# Patient Record
Sex: Female | Born: 1970 | Race: White | Hispanic: No | Marital: Married | Smoking: Never smoker
Health system: Southern US, Community
[De-identification: ages and names within clinical notes are randomized; demographics above are authoritative.]

## PROBLEM LIST (undated history)

## (undated) DIAGNOSIS — B029 Zoster without complications: Secondary | ICD-10-CM

## (undated) DIAGNOSIS — G43909 Migraine, unspecified, not intractable, without status migrainosus: Secondary | ICD-10-CM

## (undated) DIAGNOSIS — J45909 Unspecified asthma, uncomplicated: Secondary | ICD-10-CM

## (undated) HISTORY — PX: HEMORRHOID SURGERY: SHX153

## (undated) HISTORY — PX: NOVASURE ABLATION: SHX5394

## (undated) HISTORY — PX: LASIK: SHX215

## (undated) HISTORY — DX: Migraine, unspecified, not intractable, without status migrainosus: G43.909

## (undated) HISTORY — DX: Zoster without complications: B02.9

## (undated) HISTORY — PX: ANKLE SURGERY: SHX546

## (undated) HISTORY — PX: NASAL SINUS SURGERY: SHX719

## (undated) HISTORY — PX: CHOLECYSTECTOMY: SHX55

---

## 2003-11-09 ENCOUNTER — Emergency Department: Payer: Self-pay | Admitting: Emergency Medicine

## 2005-02-17 ENCOUNTER — Ambulatory Visit: Payer: Self-pay | Admitting: Obstetrics and Gynecology

## 2005-02-25 ENCOUNTER — Ambulatory Visit: Payer: Self-pay | Admitting: Obstetrics and Gynecology

## 2005-06-14 ENCOUNTER — Ambulatory Visit: Payer: Self-pay | Admitting: Unknown Physician Specialty

## 2006-01-31 ENCOUNTER — Emergency Department: Payer: Self-pay | Admitting: Emergency Medicine

## 2006-03-28 ENCOUNTER — Ambulatory Visit: Payer: Self-pay | Admitting: Gastroenterology

## 2006-07-28 ENCOUNTER — Ambulatory Visit: Payer: Self-pay | Admitting: Obstetrics and Gynecology

## 2006-08-05 ENCOUNTER — Ambulatory Visit: Payer: Self-pay | Admitting: Obstetrics and Gynecology

## 2007-03-08 ENCOUNTER — Ambulatory Visit: Payer: Self-pay | Admitting: Obstetrics and Gynecology

## 2010-04-01 ENCOUNTER — Emergency Department: Payer: Self-pay | Admitting: Emergency Medicine

## 2012-02-26 ENCOUNTER — Emergency Department: Payer: Self-pay | Admitting: Emergency Medicine

## 2012-02-26 LAB — COMPREHENSIVE METABOLIC PANEL
Albumin: 4 g/dL (ref 3.4–5.0)
Anion Gap: 6 — ABNORMAL LOW (ref 7–16)
Bilirubin,Total: 0.4 mg/dL (ref 0.2–1.0)
Calcium, Total: 8.5 mg/dL (ref 8.5–10.1)
Chloride: 110 mmol/L — ABNORMAL HIGH (ref 98–107)
Co2: 24 mmol/L (ref 21–32)
EGFR (African American): >60
EGFR (Non-African Amer.): >60
Glucose: 103 mg/dL — ABNORMAL HIGH (ref 65–99)
Osmolality: 280 (ref 275–301)
Potassium: 4.1 mmol/L (ref 3.5–5.1)
SGPT (ALT): 24 U/L (ref 12–78)
Sodium: 140 mmol/L (ref 136–145)

## 2012-02-26 LAB — CBC
MCV: 94 fL (ref 80–100)
RBC: 4.52 10*6/uL (ref 3.80–5.20)
RDW: 12.6 % (ref 11.5–14.5)

## 2012-02-26 LAB — TROPONIN I: Troponin-I: <0.02 ng/mL

## 2012-02-27 LAB — CK TOTAL AND CKMB (NOT AT ARMC): CK-MB: <0.5 ng/mL — ABNORMAL LOW (ref 0.5–3.6)

## 2012-06-07 ENCOUNTER — Ambulatory Visit: Payer: Self-pay | Admitting: Obstetrics and Gynecology

## 2013-06-08 ENCOUNTER — Ambulatory Visit: Payer: Self-pay | Admitting: Obstetrics and Gynecology

## 2014-05-20 ENCOUNTER — Ambulatory Visit: Payer: Self-pay

## 2014-05-21 ENCOUNTER — Other Ambulatory Visit: Payer: Self-pay | Admitting: Obstetrics and Gynecology

## 2014-05-21 DIAGNOSIS — Z1231 Encounter for screening mammogram for malignant neoplasm of breast: Secondary | ICD-10-CM

## 2014-05-30 ENCOUNTER — Encounter: Payer: Self-pay | Admitting: *Deleted

## 2014-05-30 ENCOUNTER — Encounter
Admission: RE | Disposition: A | Discharge status: Home / Self Care | Payer: Self-pay | Source: Ambulatory Visit | Attending: Surgery

## 2014-05-30 ENCOUNTER — Ambulatory Visit: Payer: No Typology Code available for payment source | Admitting: Anesthesiology

## 2014-05-30 ENCOUNTER — Ambulatory Visit
Admission: RE | Admit: 2014-05-30 | Discharge: 2014-05-30 | Disposition: A | Discharge status: Home / Self Care | Payer: No Typology Code available for payment source | Source: Ambulatory Visit | Attending: Surgery | Admitting: Surgery

## 2014-05-30 DIAGNOSIS — K648 Other hemorrhoids: Secondary | ICD-10-CM | POA: Diagnosis not present

## 2014-05-30 DIAGNOSIS — K644 Residual hemorrhoidal skin tags: Secondary | ICD-10-CM | POA: Diagnosis not present

## 2014-05-30 HISTORY — PX: HEMORRHOID SURGERY: SHX153

## 2014-05-30 SURGERY — HEMORRHOIDECTOMY
Anesthesia: General

## 2014-05-30 MED ORDER — FENTANYL CITRATE (PF) 100 MCG/2ML IJ SOLN
INTRAMUSCULAR | Status: DC | PRN
  Administered 2014-05-30 (×3): 50 ug via INTRAVENOUS
  Administered 2014-05-30: 100 ug via INTRAVENOUS

## 2014-05-30 MED ORDER — FAMOTIDINE 20 MG PO TABS
ORAL_TABLET | ORAL | Status: AC
  Administered 2014-05-30: 20 mg via ORAL
  Filled 2014-05-30: qty 1

## 2014-05-30 MED ORDER — LIDOCAINE HCL (CARDIAC) 20 MG/ML IV SOLN
INTRAVENOUS | Status: DC | PRN
  Administered 2014-05-30: 100 mg via INTRAVENOUS

## 2014-05-30 MED ORDER — BUPIVACAINE-EPINEPHRINE (PF) 0.5% -1:200000 IJ SOLN
INTRAMUSCULAR | Status: AC
  Filled 2014-05-30: qty 30

## 2014-05-30 MED ORDER — FAMOTIDINE 20 MG PO TABS
20.0000 mg | ORAL_TABLET | Freq: Once | ORAL | Status: AC
  Administered 2014-05-30: 20 mg via ORAL

## 2014-05-30 MED ORDER — FENTANYL CITRATE (PF) 100 MCG/2ML IJ SOLN: 25.0000 ug | INTRAMUSCULAR | Status: DC | PRN

## 2014-05-30 MED ORDER — ONDANSETRON HCL 4 MG/2ML IJ SOLN: 4.0000 mg | Freq: Once | INTRAMUSCULAR | Status: DC | PRN

## 2014-05-30 MED ORDER — PHENYLEPHRINE HCL 10 MG/ML IJ SOLN
INTRAMUSCULAR | Status: DC | PRN
  Administered 2014-05-30: 150 ug via INTRAVENOUS

## 2014-05-30 MED ORDER — LACTATED RINGERS IV SOLN
INTRAVENOUS | Status: DC
  Administered 2014-05-30 (×2): via INTRAVENOUS

## 2014-05-30 MED ORDER — MIDAZOLAM HCL 2 MG/2ML IJ SOLN
INTRAMUSCULAR | Status: DC | PRN
  Administered 2014-05-30: 1 mg via INTRAVENOUS

## 2014-05-30 MED ORDER — BUPIVACAINE LIPOSOME 1.3 % IJ SUSP
INTRAMUSCULAR | Status: DC | PRN
  Administered 2014-05-30: 20 mL

## 2014-05-30 MED ORDER — DEXAMETHASONE SODIUM PHOSPHATE 4 MG/ML IJ SOLN
INTRAMUSCULAR | Status: DC | PRN
  Administered 2014-05-30: 5 mg via INTRAVENOUS

## 2014-05-30 MED ORDER — DIPHENHYDRAMINE HCL 50 MG/ML IJ SOLN
INTRAMUSCULAR | Status: DC | PRN
Start: 2014-05-30 — End: 2014-05-30
  Administered 2014-05-30: 10 mg via INTRAVENOUS

## 2014-05-30 MED ORDER — OXYCODONE-ACETAMINOPHEN 5-325 MG PO TABS: 1.0000 | ORAL_TABLET | ORAL | Status: DC | PRN

## 2014-05-30 MED ORDER — PROPOFOL 10 MG/ML IV BOLUS
INTRAVENOUS | Status: DC | PRN
  Administered 2014-05-30: 30 mg via INTRAVENOUS
  Administered 2014-05-30: 20 mg via INTRAVENOUS
  Administered 2014-05-30: 150 mg via INTRAVENOUS

## 2014-05-30 MED ORDER — ONDANSETRON HCL 4 MG/2ML IJ SOLN
INTRAMUSCULAR | Status: DC | PRN
  Administered 2014-05-30: 4 mg via INTRAVENOUS

## 2014-05-30 SURGICAL SUPPLY — 25 items
BLADE SURG 15 STRL LF DISP TIS (BLADE) ×1 IMPLANT
BLADE SURG 15 STRL SS (BLADE) ×2
CANISTER SUCT 1200ML W/VALVE (MISCELLANEOUS) ×3 IMPLANT
DRAPE LAPAROTOMY 100X77 ABD (DRAPES) ×3 IMPLANT
DRAPE LEGGINS SURG 28X43 STRL (DRAPES) ×3 IMPLANT
GAUZE SPONGE 4X4 12PLY STRL (GAUZE/BANDAGES/DRESSINGS) ×3 IMPLANT
GLOVE BIO SURGEON STRL SZ7.5 (GLOVE) ×9 IMPLANT
GOWN STRL REUS W/ TWL LRG LVL3 (GOWN DISPOSABLE) ×2 IMPLANT
GOWN STRL REUS W/TWL LRG LVL3 (GOWN DISPOSABLE) ×4
HARMONIC SCALPEL FOCUS (MISCELLANEOUS) ×3 IMPLANT
JELLY LUB 2OZ STRL (MISCELLANEOUS) ×2
JELLY LUBE 2OZ STRL (MISCELLANEOUS) ×1 IMPLANT
LABEL OR SOLS (LABEL) ×3 IMPLANT
NDL SAFETY 25GX1.5 (NEEDLE) ×3 IMPLANT
NS IRRIG 500ML POUR BTL (IV SOLUTION) ×3 IMPLANT
PACK BASIN MINOR ARMC (MISCELLANEOUS) ×3 IMPLANT
PAD GROUND ADULT SPLIT (MISCELLANEOUS) ×3 IMPLANT
PAD PREP 24X41 OB/GYN DISP (PERSONAL CARE ITEMS) IMPLANT
SOL PREP PVP 2OZ (MISCELLANEOUS) ×6
SOLUTION PREP PVP 2OZ (MISCELLANEOUS) ×2 IMPLANT
STAPLER PROXIMATE HCS (STAPLE) IMPLANT
STRAP SAFETY BODY (MISCELLANEOUS) ×3 IMPLANT
SUT CHROMIC 0 SH (SUTURE) ×3 IMPLANT
SUT CHROMIC 2 0 SH (SUTURE) ×3 IMPLANT
SYRINGE 10CC LL (SYRINGE) ×3 IMPLANT

## 2014-05-30 NOTE — Op Note (Signed)
Preoperative diagnosis hemorrhoids  Postoperative diagnosis hemorrhoids  Procedure internal and external hemorrhoidectomy  Anesthesia Gen.  Surgeon Renda RollsWilton Cathaleen Korol  Indications: She is a history of anal pain and swelling and bleeding. Internal and external hemorrhoids were demonstrated on physical exam.  With the patient on the operating table in the supine position she was placed under general anesthesia. The legs were elevated into the lithotomy position using ankle straps. The anal area was prepared with Betadine solution and draped in a sterile manner. The anal canal was dilated large enough to admit 3 fingers. The anal canal was then further dilated with a bivalve anal retractor. Large internal and external hemorrhoids were demonstrated at the 5:00 7:00 and 11:00 position. First the hemorrhoid at the 7:00 position was removed by placing a 2-0 chromic suture ligature of both the internal component. The external component was scored with a scalpel. Dissection was carried out with electrocautery. The hemorrhoid was dissected free from the underlying internal anal sphincter. The hemorrhoid was then ligated with the same suture ligature and amputated. Several small bleeding points are cauterized. The wound was closed with a running 2-0 chromic locked tied stitch leaving a small opening externally for drainage.  A similar procedure was carried out at the 11:00 position with a similar 2-0 chromic suture ligature there was a large external component which was scored with a scalpel and dissected free from surrounding structures. The excision was carried out up over the internal anal sphincter to the previously placed suture ligature and further ligated. The hemorrhoid was amputated. The wound was inspected and several small bleeding points were cauterized. The wound was closed with a running locked tied 2-0 chromic suture leaving a small opening externally for drainage.  A similar procedure was carried out  at the 5:00 position lacing and 3-0 chromic suture ligature at the upper end of the internal component. The external component was scored with a scalpel. The hemorrhoid was dissected away from the underlying internal anal sphincter with use of electrocautery. When the partial cauterized. The wound was closed in a similar manner with running locked tied 3-0 chromic suture  There was another skin tag incised at approximately 1:00 position and repaired with 3-0 chromic.  Hemostasis was intact. The site was then prepared with Betadine solution the skin and subcutaneous tissues and deeper tissues surrounding the sphincter were infiltrated with 20 cc of Exparel. Dressings were applied with paper tape. The patient appeared to tolerate the procedure satisfactorily and was then prepared for transfer to the recovery room.  Estimated blood loss 10 cc.

## 2014-05-30 NOTE — Progress Notes (Signed)
Report given to Thrivent Financialterry duncan, rn.

## 2014-05-30 NOTE — Anesthesia Postprocedure Evaluation (Signed)
  Anesthesia Post-op Note  Patient: Diane Smith  Procedure(s) Performed: Procedure(s): HEMORRHOIDECTOMY (N/A)  Anesthesia type:General LMA  Patient location: PACU  Post pain: Pain level controlled  Post assessment: Post-op Vital signs reviewed, Patient's Cardiovascular Status Stable, Respiratory Function Stable, Patent Airway and No signs of Nausea or vomiting  Post vital signs: Reviewed and stable  Last Vitals:  Filed Vitals:   05/30/14 0938  BP: 137/85  Pulse: 95  Temp:   Resp: 16    Level of consciousness: awake, alert  and patient cooperative  Complications: No apparent anesthesia complications

## 2014-05-30 NOTE — Discharge Instructions (Signed)
°   Call MD for:  temperature >100.4  Renda RollsWilton Smith, MD New at Discharge     Call MD for:  persistant nausea and vomiting  Renda RollsWilton Smith, MD New at Discharge     Call MD for:  severe uncontrolled pain  Renda RollsWilton Smith, MD New at Discharge     Call MD for:  redness, tenderness, or signs of infection (pain, swelling, redness, odor or green/yellow discharge around incision site)  Renda RollsWilton Smith, MD New at Discharge     Call MD for:  difficulty breathing, headache or visual disturbances  Renda RollsWilton Smith, MD New at Discharge     Call MD for:  hives  Renda RollsWilton Smith, MD New at Discharge     Call MD for:  persistant dizziness or light-headedness  Renda RollsWilton Smith, MD New at Discharge     Call MD for:  extreme fatigue  Renda RollsWilton Smith, MD New at Discharge     Discharge instructions  Renda RollsWilton Smith, MD New at Discharge

## 2014-05-30 NOTE — Anesthesia Preprocedure Evaluation (Signed)
Anesthesia Evaluation  Patient identified by MRN, date of birth, ID band Patient awake    Reviewed: Allergy & Precautions, H&P , NPO status , Patient's Chart, lab work & pertinent test results, reviewed documented beta blocker date and time   History of Anesthesia Complications (+) PONV  Airway Mallampati: II  TM Distance: >3 FB Neck ROM: full    Dental   Pulmonary Current Smoker,          Cardiovascular Rate:Normal     Neuro/Psych  Headaches,    GI/Hepatic   Endo/Other    Renal/GU      Musculoskeletal   Abdominal   Peds  Hematology   Anesthesia Other Findings   Reproductive/Obstetrics                             Anesthesia Physical Anesthesia Plan  ASA: II  Anesthesia Plan: General LMA   Post-op Pain Management:    Induction:   Airway Management Planned:   Additional Equipment:   Intra-op Plan:   Post-operative Plan:   Informed Consent: I have reviewed the patients History and Physical, chart, labs and discussed the procedure including the risks, benefits and alternatives for the proposed anesthesia with the patient or authorized representative who has indicated his/her understanding and acceptance.     Plan Discussed with: CRNA  Anesthesia Plan Comments:         Anesthesia Quick Evaluation

## 2014-05-30 NOTE — Transfer of Care (Signed)
Immediate Anesthesia Transfer of Care Note  Patient: Diane Smith  Procedure(s) Performed: Procedure(s): HEMORRHOIDECTOMY (N/A)  Patient Location: PACU  Anesthesia Type:General  Level of Consciousness: sedated  Airway & Oxygen Therapy: Patient connected to nasal cannula oxygen  Post-op Assessment: Report given to RN  Post vital signs: Reviewed and stable  Last Vitals:  Filed Vitals:   05/30/14 0633  BP: 121/81  Pulse: 99  Temp: 37.6 C  Resp: 16    Complications: No apparent anesthesia complications

## 2014-05-30 NOTE — H&P (Signed)
  Date of Initial H&P: 05/01/2014  History reviewed, patient examined, no change in status, stable for surgery. 

## 2014-05-30 NOTE — Anesthesia Procedure Notes (Signed)
Procedure Name: LMA Insertion Date/Time: 05/30/2014 7:41 AM Performed by: Renda RollsSMITH, WILTON Oxygen Delivery Method: Circle system utilized Preoxygenation: Pre-oxygenation with 100% oxygen LMA Size: 4.0 Dental Injury: Teeth and Oropharynx as per pre-operative assessment

## 2014-05-31 LAB — SURGICAL PATHOLOGY

## 2014-06-01 ENCOUNTER — Emergency Department
Admission: EM | Admit: 2014-06-01 | Discharge: 2014-06-01 | Disposition: A | Discharge status: Home / Self Care | Payer: No Typology Code available for payment source | Attending: Emergency Medicine | Admitting: Emergency Medicine

## 2014-06-01 ENCOUNTER — Encounter: Payer: Self-pay | Admitting: Emergency Medicine

## 2014-06-01 ENCOUNTER — Other Ambulatory Visit: Payer: Self-pay

## 2014-06-01 ENCOUNTER — Emergency Department: Payer: No Typology Code available for payment source

## 2014-06-01 DIAGNOSIS — I951 Orthostatic hypotension: Secondary | ICD-10-CM | POA: Insufficient documentation

## 2014-06-01 DIAGNOSIS — Z79899 Other long term (current) drug therapy: Secondary | ICD-10-CM | POA: Diagnosis not present

## 2014-06-01 DIAGNOSIS — R55 Syncope and collapse: Secondary | ICD-10-CM | POA: Diagnosis present

## 2014-06-01 LAB — BASIC METABOLIC PANEL
ANION GAP: 7 (ref 5–15)
BUN: 13 mg/dL (ref 6–20)
CO2: 24 mmol/L (ref 22–32)
CREATININE: 0.57 mg/dL (ref 0.44–1.00)
Calcium: 8.2 mg/dL — ABNORMAL LOW (ref 8.9–10.3)
Chloride: 101 mmol/L (ref 101–111)
GFR calc Af Amer: >60 mL/min (ref >60)
Glucose, Bld: 104 mg/dL — ABNORMAL HIGH (ref 65–99)
Potassium: 3.2 mmol/L — ABNORMAL LOW (ref 3.5–5.1)
Sodium: 132 mmol/L — ABNORMAL LOW (ref 135–145)

## 2014-06-01 LAB — URINALYSIS COMPLETE WITH MICROSCOPIC (ARMC ONLY)
Bilirubin Urine: NEGATIVE
GLUCOSE, UA: NEGATIVE mg/dL
Hgb urine dipstick: NEGATIVE
LEUKOCYTES UA: NEGATIVE
NITRITE: NEGATIVE
Protein, ur: NEGATIVE mg/dL
Specific Gravity, Urine: 1.006 (ref 1.005–1.030)
pH: 8 (ref 5.0–8.0)

## 2014-06-01 LAB — CBC
HCT: 41 % (ref 35.0–47.0)
Hemoglobin: 14 g/dL (ref 12.0–16.0)
MCH: 32.3 pg (ref 26.0–34.0)
MCHC: 34.1 g/dL (ref 32.0–36.0)
MCV: 94.8 fL (ref 80.0–100.0)
PLATELETS: 284 10*3/uL (ref 150–440)
RBC: 4.32 MIL/uL (ref 3.80–5.20)
RDW: 12.8 % (ref 11.5–14.5)
WBC: 7.9 10*3/uL (ref 3.6–11.0)

## 2014-06-01 LAB — TROPONIN I

## 2014-06-01 MED ORDER — SODIUM CHLORIDE 0.9 % IV BOLUS (SEPSIS)
1000.0000 mL | Freq: Once | INTRAVENOUS | Status: AC
  Administered 2014-06-01: 1000 mL via INTRAVENOUS

## 2014-06-01 MED ORDER — SENNOSIDES-DOCUSATE SODIUM 8.6-50 MG PO TABS
2.0000 | ORAL_TABLET | Freq: Once | ORAL | Status: AC
  Administered 2014-06-01: 2 via ORAL

## 2014-06-01 MED ORDER — OXYCODONE-ACETAMINOPHEN 5-325 MG PO TABS
1.0000 | ORAL_TABLET | Freq: Once | ORAL | Status: AC
  Administered 2014-06-01: 1 via ORAL

## 2014-06-01 MED ORDER — OXYCODONE-ACETAMINOPHEN 5-325 MG PO TABS
ORAL_TABLET | ORAL | Status: AC
  Filled 2014-06-01: qty 1

## 2014-06-01 MED ORDER — SENNOSIDES-DOCUSATE SODIUM 8.6-50 MG PO TABS
ORAL_TABLET | ORAL | Status: AC
  Filled 2014-06-01: qty 2

## 2014-06-01 NOTE — ED Provider Notes (Signed)
[facility] Emergency Department Provider Note  ____________________________________________  Time seen: 1500  I have reviewed the triage vital signs and the nursing notes.   HISTORY  Chief Complaint Near Syncope      HPI Erie NoeKimberly M Chevez is a 44 y.o. female presents with 3 episodes of syncope. Per patient and spouse at bedside patient had syncopal episode yesterday while while attempting to defecate. Patient had another episode following. Today patient had a near syncopal episode while sitting at the table. Of note patient really recently underwenthemorrhoid surgery by Dr. Corwin LevinsWilliam Smith on 05/30/2014. Patient denies any bowel movements since surgery. Patient denies any recent illness. Denies nausea or vomiting or diarrhea.     Past Medical History  Diagnosis Date  . Shingles   . Migraine     There are no active problems to display for this patient.   Past Surgical History  Procedure Laterality Date  . Cholecystectomy    . Novasure ablation    . Nasal sinus surgery    . Ankle surgery Left     with screw  . Lasik    . Hemorrhoid surgery      Current Outpatient Rx  Name  Route  Sig  Dispense  Refill  . cholecalciferol (VITAMIN D) 1000 UNITS tablet   Oral   Take 5,000 Units by mouth daily.         Marland Kitchen. Fexofenadine-Pseudoephedrine (ALLEGRA-D 24 HOUR PO)   Oral   Take 1 tablet by mouth daily.         Marland Kitchen. oxyCODONE-acetaminophen (ROXICET) 5-325 MG per tablet   Oral   Take 1-2 tablets by mouth every 4 (four) hours as needed for severe pain.   30 tablet   0     Allergies Imodium a-d  No family history on file.  Social History History  Substance Use Topics  . Smoking status: Never Smoker   . Smokeless tobacco: Not on file  . Alcohol Use: 0.0 oz/week    0 Glasses of wine per week    Review of Systems  Constitutional: Negative for fever. Eyes: Negative for visual changes. ENT: Negative for sore throat. Cardiovascular:  Negative for chest pain. Respiratory: Negative for shortness of breath. Gastrointestinal: Negative for abdominal pain, vomiting and diarrhea. Genitourinary: Negative for dysuria. Musculoskeletal: Negative for back pain. Skin: Negative for rash. Neurological: Negative for headaches, focal weakness or numbness.   10-point ROS otherwise negative.  ____________________________________________   PHYSICAL EXAM:  VITAL SIGNS: ED Triage Vitals  Enc Vitals Group     BP 06/01/14 1154 116/71 mmHg     Pulse Rate 06/01/14 1154 84     Resp 06/01/14 1154 18     Temp 06/01/14 1154 97.9 F (36.6 C)     Temp Source 06/01/14 1154 Oral     SpO2 06/01/14 1154 99 %     Weight 06/01/14 1154 145 lb (65.772 kg)     Height 06/01/14 1154 5\' 2"  (1.575 m)     Head Cir --      Peak Flow --      Pain Score 06/01/14 1155 5     Pain Loc --      Pain Edu? --      Excl. in GC? --     Constitutional: Alert and oriented. Well appearing and in no distress. Eyes: Conjunctivae are normal. PERRL. Normal extraocular movements. ENT   Head: Normocephalic and atraumatic.   Nose: No congestion/rhinnorhea.   Mouth/Throat: Mucous membranes are moist.  Neck: No stridor. Cardiovascular: Normal rate, regular rhythm. Normal and symmetric distal pulses are present in all extremities. No murmurs, rubs, or gallops. Respiratory: Normal respiratory effort without tachypnea nor retractions. Breath sounds are clear and equal bilaterally. No wheezes/rales/rhonchi. Gastrointestinal: Soft and nontender. No distention. There is no CVA tenderness. Genitourinary: deferred Musculoskeletal: Nontender with normal range of motion in all extremities. No joint effusions.  No lower extremity tenderness nor edema. Neurologic:  Normal speech and language. No gross focal neurologic deficits are appreciated. Speech is normal.  Skin:  Skin is warm, dry and intact. No rash noted. Psychiatric: Mood and affect are normal. Speech and  behavior are normal. Patient exhibits appropriate insight and judgment.  ____________________________________________    LABS (pertinent positives/negatives) Labs Reviewed  BASIC METABOLIC PANEL - Abnormal; Notable for the following:    Sodium 132 (*)    Potassium 3.2 (*)    Glucose, Bld 104 (*)    Calcium 8.2 (*)    All other components within normal limits  URINALYSIS COMPLETEWITH MICROSCOPIC ([facility])  - Abnormal; Notable for the following:    Color, Urine YELLOW (*)    APPearance CLEAR (*)    Ketones, ur TRACE (*)    Bacteria, UA RARE (*)    Squamous Epithelial / LPF 0-5 (*)    All other components within normal limits  CBC  TROPONIN I     ____________________________________________   EKG    Date: 06/01/2014  Rate: 78  Rhythm: normal sinus rhythm  QRS Axis: normal  Intervals: normal  ST/T Wave abnormalities: normal  Conduction Disutrbances: none  Narrative Interpretation: unremarkable      ____________________________________________    RADIOLOGY  CT head revealed no acute abnormality.  ____________________________________________   ____________________________________________   INITIAL IMPRESSION / ASSESSMENT AND PLAN / ED COURSE  Pertinent labs & imaging results that were available during my care of the patient were reviewed by me and considered in my medical decision making (see chart for details).  History and physical exam of near syncope and syncope CT head performed which was negative lab evaluation unremarkable including negative UA normal H&H. [facility] patient will be discharged home with outpatient follow-up. Patient discussed with Dr. Derrill KayWill Smith CLINICAL findings discussed patient will follow up on the outpatient setting. Cautioned both patient and husband to return to the emergency department immediately if recurrent syncopal episodes ensue. Of note patient received normal saline 2 L IV in emergency  department. Patient states: I feel better".  ____________________________________________   FINAL CLINICAL IMPRESSION(S) / ED DIAGNOSES  Final diagnoses:  Syncope due to orthostatic hypotension    Darci Currentandolph N Yutaka Holberg, MD 06/01/14 1806

## 2014-06-01 NOTE — ED Notes (Signed)
Patient return from CT

## 2014-06-01 NOTE — Discharge Instructions (Signed)
Syncope °Syncope is a medical term for fainting or passing out. This means you lose consciousness and drop to the ground. People are generally unconscious for less than 5 minutes. You may have some muscle twitches for up to 15 seconds before waking up and returning to normal. Syncope occurs more often in older adults, but it can happen to anyone. While most causes of syncope are not dangerous, syncope can be a sign of a serious medical problem. It is important to seek medical care.  °CAUSES  °Syncope is caused by a sudden drop in blood flow to the brain. The specific cause is often not determined. Factors that can bring on syncope include: °· Taking medicines that lower blood pressure. °· Sudden changes in posture, such as standing up quickly. °· Taking more medicine than prescribed. °· Standing in one place for too long. °· Seizure disorders. °· Dehydration and excessive exposure to heat. °· Low blood sugar (hypoglycemia). °· Straining to have a bowel movement. °· Heart disease, irregular heartbeat, or other circulatory problems. °· Fear, emotional distress, seeing blood, or severe pain. °SYMPTOMS  °Right before fainting, you may: °· Feel dizzy or light-headed. °· Feel nauseous. °· See all white or all black in your field of vision. °· Have cold, clammy skin. °DIAGNOSIS  °Your health care provider will ask about your symptoms, perform a physical exam, and perform an electrocardiogram (ECG) to record the electrical activity of your heart. Your health care provider may also perform other heart or blood tests to determine the cause of your syncope which may include: °· Transthoracic echocardiogram (TTE). During echocardiography, sound waves are used to evaluate how blood flows through your heart. °· Transesophageal echocardiogram (TEE). °· Cardiac monitoring. This allows your health care provider to monitor your heart rate and rhythm in real time. °· Holter monitor. This is a portable device that records your  heartbeat and can help diagnose heart arrhythmias. It allows your health care provider to track your heart activity for several days, if needed. °· Stress tests by exercise or by giving medicine that makes the heart beat faster. °TREATMENT  °In most cases, no treatment is needed. Depending on the cause of your syncope, your health care provider may recommend changing or stopping some of your medicines. °HOME CARE INSTRUCTIONS °· Have someone stay with you until you feel stable. °· Do not drive, use machinery, or play sports until your health care provider says it is okay. °· Keep all follow-up appointments as directed by your health care provider. °· Lie down right away if you start feeling like you might faint. Breathe deeply and steadily. Wait until all the symptoms have passed. °· Drink enough fluids to keep your urine clear or pale yellow. °· If you are taking blood pressure or heart medicine, get up slowly and take several minutes to sit and then stand. This can reduce dizziness. °SEEK IMMEDIATE MEDICAL CARE IF:  °· You have a severe headache. °· You have unusual pain in the chest, abdomen, or back. °· You are bleeding from your mouth or rectum, or you have black or tarry stool. °· You have an irregular or very fast heartbeat. °· You have pain with breathing. °· You have repeated fainting or seizure-like jerking during an episode. °· You faint when sitting or lying down. °· You have confusion. °· You have trouble walking. °· You have severe weakness. °· You have vision problems. °If you fainted, call your local emergency services (911 in [place] Do not drive   yourself to the hospital.  °MAKE SURE YOU: °· Understand these instructions. °· Will watch your condition. °· Will get help right away if you are not doing well or get worse. °Document Released: 01/11/2005 Document Revised: 01/16/2013 Document Reviewed: 03/12/2011 °[employer]® Patient Information ©2015 [employer]. This information is not intended to replace  advice given to you by your health care provider. Make sure you discuss any questions you have with your health care provider. ° °

## 2014-06-01 NOTE — ED Notes (Signed)
States had hemorrhoidectomy 2 days ago, near syncopal feeling x 3 since, denies blood loss

## 2014-06-01 NOTE — ED Notes (Signed)
Patient transported to CT 

## 2014-06-01 NOTE — ED Notes (Signed)
Patient has all systems WNL. However, has a recent history of syncope and near syncope while "on the commode" and during ambulation. Denies CP, ShOB, Nausea, Vomiting, Diarrhea, and Constipation. No HX of urinary dysfunction.

## 2014-06-04 ENCOUNTER — Encounter: Payer: Self-pay | Admitting: Surgery

## 2014-06-10 ENCOUNTER — Ambulatory Visit
Admission: RE | Admit: 2014-06-10 | Discharge: 2014-06-10 | Disposition: A | Discharge status: Home / Self Care | Payer: No Typology Code available for payment source | Source: Ambulatory Visit | Attending: Obstetrics and Gynecology | Admitting: Obstetrics and Gynecology

## 2014-06-10 DIAGNOSIS — Z1231 Encounter for screening mammogram for malignant neoplasm of breast: Secondary | ICD-10-CM | POA: Insufficient documentation

## 2014-12-19 ENCOUNTER — Emergency Department
Admission: EM | Admit: 2014-12-19 | Discharge: 2014-12-19 | Disposition: A | Discharge status: Home / Self Care | Payer: No Typology Code available for payment source | Attending: Emergency Medicine | Admitting: Emergency Medicine

## 2014-12-19 ENCOUNTER — Encounter: Payer: Self-pay | Admitting: *Deleted

## 2014-12-19 DIAGNOSIS — R1030 Lower abdominal pain, unspecified: Secondary | ICD-10-CM | POA: Insufficient documentation

## 2014-12-19 DIAGNOSIS — E860 Dehydration: Secondary | ICD-10-CM | POA: Insufficient documentation

## 2014-12-19 DIAGNOSIS — R519 Headache, unspecified: Secondary | ICD-10-CM

## 2014-12-19 DIAGNOSIS — R531 Weakness: Secondary | ICD-10-CM | POA: Insufficient documentation

## 2014-12-19 DIAGNOSIS — R350 Frequency of micturition: Secondary | ICD-10-CM | POA: Insufficient documentation

## 2014-12-19 DIAGNOSIS — R300 Dysuria: Secondary | ICD-10-CM | POA: Diagnosis not present

## 2014-12-19 DIAGNOSIS — R51 Headache: Secondary | ICD-10-CM | POA: Insufficient documentation

## 2014-12-19 DIAGNOSIS — R509 Fever, unspecified: Secondary | ICD-10-CM | POA: Insufficient documentation

## 2014-12-19 DIAGNOSIS — M545 Low back pain: Secondary | ICD-10-CM | POA: Diagnosis not present

## 2014-12-19 DIAGNOSIS — R000 Tachycardia, unspecified: Secondary | ICD-10-CM | POA: Insufficient documentation

## 2014-12-19 DIAGNOSIS — Z79899 Other long term (current) drug therapy: Secondary | ICD-10-CM | POA: Insufficient documentation

## 2014-12-19 LAB — COMPREHENSIVE METABOLIC PANEL
ALT: 14 U/L (ref 14–54)
ANION GAP: 6 (ref 5–15)
AST: 20 U/L (ref 15–41)
Albumin: 3.9 g/dL (ref 3.5–5.0)
Alkaline Phosphatase: 76 U/L (ref 38–126)
BUN: 16 mg/dL (ref 6–20)
CO2: 25 mmol/L (ref 22–32)
Calcium: 8.6 mg/dL — ABNORMAL LOW (ref 8.9–10.3)
Chloride: 104 mmol/L (ref 101–111)
Creatinine, Ser: 0.56 mg/dL (ref 0.44–1.00)
GFR calc Af Amer: >60 mL/min (ref >60)
Glucose, Bld: 110 mg/dL — ABNORMAL HIGH (ref 65–99)
Potassium: 3.7 mmol/L (ref 3.5–5.1)
SODIUM: 135 mmol/L (ref 135–145)
Total Bilirubin: 0.8 mg/dL (ref 0.3–1.2)
Total Protein: 7 g/dL (ref 6.5–8.1)

## 2014-12-19 LAB — CBC WITH DIFFERENTIAL/PLATELET
BASOS ABS: 0 10*3/uL (ref 0–0.1)
BASOS PCT: 0 %
EOS ABS: 0.2 10*3/uL (ref 0–0.7)
EOS PCT: 2 %
HCT: 42.1 % (ref 35.0–47.0)
Hemoglobin: 13.9 g/dL (ref 12.0–16.0)
Lymphocytes Relative: 2 %
Lymphs Abs: 0.3 10*3/uL — ABNORMAL LOW (ref 1.0–3.6)
MCH: 31.1 pg (ref 26.0–34.0)
MCHC: 33 g/dL (ref 32.0–36.0)
MCV: 94.3 fL (ref 80.0–100.0)
Monocytes Absolute: 0.7 10*3/uL (ref 0.2–0.9)
Monocytes Relative: 5 %
NEUTROS PCT: 91 %
Neutro Abs: 13.8 10*3/uL — ABNORMAL HIGH (ref 1.4–6.5)
PLATELETS: 291 10*3/uL (ref 150–440)
RBC: 4.47 MIL/uL (ref 3.80–5.20)
RDW: 12.6 % (ref 11.5–14.5)
WBC: 15 10*3/uL — AB (ref 3.6–11.0)

## 2014-12-19 LAB — URINALYSIS COMPLETE WITH MICROSCOPIC (ARMC ONLY)
BILIRUBIN URINE: NEGATIVE
Glucose, UA: NEGATIVE mg/dL
Hgb urine dipstick: NEGATIVE
LEUKOCYTES UA: NEGATIVE
NITRITE: POSITIVE — AB
PH: 6 (ref 5.0–8.0)
Protein, ur: NEGATIVE mg/dL
RBC / HPF: NONE SEEN RBC/hpf (ref 0–5)
SPECIFIC GRAVITY, URINE: 1.018 (ref 1.005–1.030)

## 2014-12-19 LAB — LIPASE, BLOOD: LIPASE: 24 U/L (ref 11–51)

## 2014-12-19 MED ORDER — DIPHENHYDRAMINE HCL 25 MG PO CAPS: 25.0000 mg | ORAL_CAPSULE | Freq: Four times a day (QID) | ORAL | Status: DC | PRN

## 2014-12-19 MED ORDER — DIPHENHYDRAMINE HCL 50 MG/ML IJ SOLN
25.0000 mg | Freq: Once | INTRAMUSCULAR | Status: AC
  Administered 2014-12-19: 25 mg via INTRAVENOUS
  Filled 2014-12-19: qty 1

## 2014-12-19 MED ORDER — METOCLOPRAMIDE HCL 5 MG/ML IJ SOLN
10.0000 mg | Freq: Once | INTRAMUSCULAR | Status: AC
  Administered 2014-12-19: 10 mg via INTRAVENOUS
  Filled 2014-12-19: qty 2

## 2014-12-19 MED ORDER — METOCLOPRAMIDE HCL 10 MG PO TABS: 10.0000 mg | ORAL_TABLET | Freq: Three times a day (TID) | ORAL | Status: DC

## 2014-12-19 MED ORDER — METHYLPREDNISOLONE SODIUM SUCC 125 MG IJ SOLR
125.0000 mg | Freq: Once | INTRAMUSCULAR | Status: AC
  Administered 2014-12-19: 125 mg via INTRAVENOUS
  Filled 2014-12-19: qty 2

## 2014-12-19 MED ORDER — SODIUM CHLORIDE 0.9 % IV BOLUS (SEPSIS)
2000.0000 mL | Freq: Once | INTRAVENOUS | Status: AC
  Administered 2014-12-19: 1000 mL via INTRAVENOUS

## 2014-12-19 MED ORDER — KETOROLAC TROMETHAMINE 30 MG/ML IJ SOLN
30.0000 mg | Freq: Once | INTRAMUSCULAR | Status: AC
  Administered 2014-12-19: 30 mg via INTRAVENOUS
  Filled 2014-12-19: qty 1

## 2014-12-19 NOTE — Discharge Instructions (Signed)
Dehydration, Adult Dehydration is a condition in which you do not have enough fluid or water in your body. It happens when you take in less fluid than you lose. Vital organs such as the kidneys, brain, and heart cannot function without a proper amount of fluids. Any loss of fluids from the body can cause dehydration.  Dehydration can range from mild to severe. This condition should be treated right away to help prevent it from becoming severe. CAUSES  This condition may be caused by:  Vomiting.  Diarrhea.  Excessive sweating, such as when exercising in hot or humid weather.  Not drinking enough fluid during strenuous exercise or during an illness.  Excessive urine output.  Fever.  Certain medicines. RISK FACTORS This condition is more likely to develop in:  People who are taking certain medicines that cause the body to lose excess fluid (diuretics).   People who have a chronic illness, such as diabetes, that may increase urination.  Older adults.   People who live at high altitudes.   People who participate in endurance sports.  SYMPTOMS  Mild Dehydration  Thirst.  Dry lips.  Slightly dry mouth.  Dry, warm skin. Moderate Dehydration  Very dry mouth.   Muscle cramps.   Dark urine and decreased urine production.   Decreased tear production.   Headache.   Light-headedness, especially when you stand up from a sitting position.  Severe Dehydration  Changes in skin.   Cold and clammy skin.   Skin does not spring back quickly when lightly pinched and released.   Changes in body fluids.   Extreme thirst.   No tears.   Not able to sweat when body temperature is high, such as in hot weather.   Minimal urine production.   Changes in vital signs.   Rapid, weak pulse (more than 100 beats per minute when you are sitting still).   Rapid breathing.   Low blood pressure.   Other changes.   Sunken eyes.   Cold hands and feet.    Confusion.  Lethargy and difficulty being awakened.  Fainting (syncope).   Short-term weight loss.   Unconsciousness. DIAGNOSIS  This condition may be diagnosed based on your symptoms. You may also have tests to determine how severe your dehydration is. These tests may include:   Urine tests.   Blood tests.  TREATMENT  Treatment for this condition depends on the severity. Mild or moderate dehydration can often be treated at home. Treatment should be started right away. Do not wait until dehydration becomes severe. Severe dehydration needs to be treated at the hospital. Treatment for Mild Dehydration  Drinking plenty of water to replace the fluid you have lost.   Replacing minerals in your blood (electrolytes) that you may have lost.  Treatment for Moderate Dehydration  Consuming oral rehydration solution (ORS). Treatment for Severe Dehydration  Receiving fluid through an IV tube.   Receiving electrolyte solution through a feeding tube that is passed through your nose and into your stomach (nasogastric tube or NG tube).  Correcting any abnormalities in electrolytes. HOME CARE INSTRUCTIONS   Drink enough fluid to keep your urine clear or pale yellow.   Drink water or fluid slowly by taking small sips. You can also try sucking on ice cubes.  Have food or beverages that contain electrolytes. Examples include bananas and sports drinks.  Take over-the-counter and prescription medicines only as told by your health care provider.   Prepare ORS according to the manufacturer's instructions. Take sips  of ORS every 5 minutes until your urine returns to normal.  If you have vomiting or diarrhea, continue to try to drink water, ORS, or both.   If you have diarrhea, avoid:   Beverages that contain caffeine.   Fruit juice.   Milk.   Carbonated soft drinks.  Do not take salt tablets. This can lead to the condition of having too much sodium in your body  (hypernatremia).  SEEK MEDICAL CARE IF:  You cannot eat or drink without vomiting.  You have had moderate diarrhea during a period of more than 24 hours.  You have a fever. SEEK IMMEDIATE MEDICAL CARE IF:   You have extreme thirst.  You have severe diarrhea.  You have not urinated in 6-8 hours, or you have urinated only a small amount of very dark urine.  You have shriveled skin.  You are dizzy, confused, or both.   This information is not intended to replace advice given to you by your health care provider. Make sure you discuss any questions you have with your health care provider.   Document Released: 01/11/2005 Document Revised: 10/02/2014 Document Reviewed: 05/29/2014 Elsevier Interactive Patient Education 2016 [employer]  General Headache Without Cause A headache is pain or discomfort felt around the head or neck area. The specific cause of a headache may not be found. There are many causes and types of headaches. A few common ones are:  Tension headaches.  Migraine headaches.  Cluster headaches.  Chronic daily headaches. HOME CARE INSTRUCTIONS  Watch your condition for any changes. Take these steps to help with your condition: Managing Pain  Take over-the-counter and prescription medicines only as told by your health care provider.  Lie down in a dark, quiet room when you have a headache.  If directed, apply ice to the head and neck area:  Put ice in a plastic bag.  Place a towel between your skin and the bag.  Leave the ice on for 20 minutes, 2-3 times per day.  Use a heating pad or hot shower to apply heat to the head and neck area as told by your health care provider.  Keep lights dim if bright lights bother you or make your headaches worse. Eating and Drinking  Eat meals on a regular schedule.  Limit alcohol use.  Decrease the amount of caffeine you drink, or stop drinking caffeine. General Instructions  Keep all follow-up visits as told  by your health care provider. This is important.  Keep a headache journal to help find out what may trigger your headaches. For example, write down:  What you eat and drink.  How much sleep you get.  Any change to your diet or medicines.  Try massage or other relaxation techniques.  Limit stress.  Sit up straight, and do not tense your muscles.  Do not use tobacco products, including cigarettes, chewing tobacco, or e-cigarettes. If you need help quitting, ask your health care provider.  Exercise regularly as told by your health care provider.  Sleep on a regular schedule. Get 7-9 hours of sleep, or the amount recommended by your health care provider. SEEK MEDICAL CARE IF:   Your symptoms are not helped by medicine.  You have a headache that is different from the usual headache.  You have nausea or you vomit.  You have a fever. SEEK IMMEDIATE MEDICAL CARE IF:   Your headache becomes severe.  You have repeated vomiting.  You have a stiff neck.  You have a loss  of vision.  You have problems with speech.  You have pain in the eye or ear.  You have muscular weakness or loss of muscle control.  You lose your balance or have trouble walking.  You feel faint or pass out.  You have confusion.   This information is not intended to replace advice given to you by your health care provider. Make sure you discuss any questions you have with your health care provider.   Document Released: 01/11/2005 Document Revised: 10/02/2014 Document Reviewed: 05/06/2014 Elsevier Interactive Patient Education [employer]

## 2014-12-19 NOTE — ED Notes (Signed)
Dx with uti fe wdays ago, now has headache weakness

## 2014-12-19 NOTE — ED Provider Notes (Signed)
[facility] Emergency Department Provider Note  ____________________________________________  Time seen: 9:30 AM  I have reviewed the triage vital signs and the nursing notes.   HISTORY  Chief Complaint Headache    HPI Diane Smith is a 44 y.o. female who complains of generalized weakness and diffuse headache since yesterday. She was diagnosed with urinary tract infection 2 or 3 days ago, started on Macrobid. She's been having a fever to 102.5 at home. No vomiting diarrhea chest pain or shortness of breath.She does have vague abdominal pain and lower back pain.     Past Medical History  Diagnosis Date  . Shingles   . Migraine      There are no active problems to display for this patient.    Past Surgical History  Procedure Laterality Date  . Cholecystectomy    . Novasure ablation    . Nasal sinus surgery    . Ankle surgery Left     with screw  . Lasik    . Hemorrhoid surgery    . Hemorrhoid surgery N/A 05/30/2014    Procedure: HEMORRHOIDECTOMY;  Surgeon: Renda Rolls, MD;  Location: [facility] ORS;  Service: General;  Laterality: N/A;     Current Outpatient Rx  Name  Route  Sig  Dispense  Refill  . cholecalciferol (VITAMIN D) 1000 UNITS tablet   Oral   Take 5,000 Units by mouth daily.         . diphenhydrAMINE (BENADRYL) 25 mg capsule   Oral   Take 1 capsule (25 mg total) by mouth every 6 (six) hours as needed.   30 capsule   0   . Fexofenadine-Pseudoephedrine (ALLEGRA-D 24 HOUR PO)   Oral   Take 1 tablet by mouth daily as needed. For allergies         . metoCLOPramide (REGLAN) 10 MG tablet   Oral   Take 1 tablet (10 mg total) by mouth 4 (four) times daily -  before meals and at bedtime.   60 tablet   0   . oxyCODONE-acetaminophen (ROXICET) 5-325 MG per tablet   Oral   Take 1-2 tablets by mouth every 4 (four) hours as needed for severe pain.   30 tablet   0      Allergies Imodium a-d   Family History  Problem  Relation Age of Onset  . Breast cancer Maternal Aunt 55  . Breast cancer Maternal Aunt   . Breast cancer Maternal Aunt     Social History Social History  Substance Use Topics  . Smoking status: Never Smoker   . Smokeless tobacco: None  . Alcohol Use: 0.0 oz/week    0 Glasses of wine per week    Review of Systems  Constitutional:   Positive fever. No weight changes Eyes:   No blurry vision or double vision.  ENT:   No sore throat. Cardiovascular:   No chest pain. Respiratory:   No dyspnea or cough. Gastrointestinal:   Positive for abdominal pain, without vomiting and diarrhea.  No BRBPR or melena. Genitourinary:   Positive for dysuria, and urinary frequency. No urinary retention, bloody urine, or difficulty urinating. Musculoskeletal:   Negative for back pain. No joint swelling or pain. Skin:   Negative for rash. Neurological:   Negative for headaches, focal weakness or numbness. Psychiatric:  No anxiety or depression.   Endocrine:  No hot/cold intolerance, changes in energy, or sleep difficulty.  10-point ROS otherwise negative.  ____________________________________________   PHYSICAL EXAM:  VITAL  SIGNS: ED Triage Vitals  Enc Vitals Group     BP 12/19/14 0916 104/73 mmHg     Pulse Rate 12/19/14 0916 132     Resp 12/19/14 0916 20     Temp 12/19/14 0916 99.2 F (37.3 C)     Temp Source 12/19/14 0916 Oral     SpO2 12/19/14 0916 94 %     Weight 12/19/14 0916 146 lb (66.225 kg)     Height 12/19/14 0916  (1.575 m)     Head Cir --      Peak Flow --      Pain Score 12/19/14 0917 7     Pain Loc --      Pain Edu? --      Excl. in GC? --      Constitutional:   Alert and oriented. Well appearing and in no distress. Eyes:   No scleral icterus. No conjunctival pallor. PERRL. EOMI without nystagmus ENT   Head:   Normocephalic and atraumatic.   Nose:   No congestion/rhinnorhea. No septal hematoma   Mouth/Throat:   MMM, no pharyngeal erythema. No  peritonsillar mass. No uvula shift.   Neck:   No stridor. No SubQ emphysema. No meningismus. Hematological/Lymphatic/Immunilogical:   No cervical lymphadenopathy. Cardiovascular:   Tachycardia heart rate 1:30. Normal and symmetric distal pulses are present in all extremities. No murmurs, rubs, or gallops. Respiratory:   Normal respiratory effort without tachypnea nor retractions. Breath sounds are clear and equal bilaterally. No wheezes/rales/rhonchi. Gastrointestinal:   Soft with mild bilateral lower quadrant tenderness. No distention. There is no CVA tenderness.  No rebound, rigidity, or guarding. Genitourinary:   deferred Musculoskeletal:   Nontender with normal range of motion in all extremities. No joint effusions.  No lower extremity tenderness.  No edema. Neurologic:   Normal speech and language.  CN 2-10 normal. Motor grossly intact. No pronator drift.  Normal gait. No gross focal neurologic deficits are appreciated.  Skin:    Skin is warm, dry and intact. No rash noted.  No petechiae, purpura, or bullae. Psychiatric:   Mood and affect are normal. Speech and behavior are normal. Patient exhibits appropriate insight and judgment.  ____________________________________________    LABS (pertinent positives/negatives) (all labs ordered are listed, but only abnormal results are displayed) Labs Reviewed  COMPREHENSIVE METABOLIC PANEL - Abnormal; Notable for the following:    Glucose, Bld 110 (*)    Calcium 8.6 (*)    All other components within normal limits  CBC WITH DIFFERENTIAL/PLATELET - Abnormal; Notable for the following:    WBC 15.0 (*)    Neutro Abs 13.8 (*)    Lymphs Abs 0.3 (*)    All other components within normal limits  URINALYSIS COMPLETEWITH MICROSCOPIC ([facility] ONLY) - Abnormal; Notable for the following:    Color, Urine AMBER (*)    APPearance CLEAR (*)    Ketones, ur 1+ (*)    Nitrite POSITIVE (*)    Bacteria, UA RARE (*)    Squamous Epithelial / LPF 0-5 (*)     All other components within normal limits  URINE CULTURE  LIPASE, BLOOD   ____________________________________________   EKG    ____________________________________________    RADIOLOGY    ____________________________________________   PROCEDURES   ____________________________________________   INITIAL IMPRESSION / ASSESSMENT AND PLAN / ED COURSE  Pertinent labs & imaging results that were available during my care of the patient were reviewed by me and considered in my medical decision making (see chart for  details).  Patient presents with generalized headache and tachycardia in the setting of recently starting Macrobid for urinary tract infection. We will recheck urine is I'm unable to find records of the urinalysis or urine culture in the electronic medical record system. We'll also check labs and give IV fluids.  ----------------------------------------- 1:33 PM on 12/19/2014 -----------------------------------------  Workup reveals nitrite positive UA without any inflammatory markers, consistent with urinary tract infection under treatment. Urine culture sent to confirm that it will be susceptible to the Macrobid. There is also 1+ ketones on the urinalysis consistent with poor oral intake and dehydration causing the tachycardia. This has improved with IV fluids. The patient is feeling much better. Repeat abdominal exam reveals that the abdomen is now soft nontender nondistended. Her headache is improving after Toradol. I'll give her some Reglan and Benadryl and Solu-Medrol as well. Low suspicion for stroke encephalitis meningitis or intracranial hemorrhage. I think that the headache is just due to overall inflammatory response to her UTI and dehydration. She is well-appearing no acute distress suitable for outpatient follow-up.   ____________________________________________   FINAL CLINICAL IMPRESSION(S) / ED DIAGNOSES  Final diagnoses:  Acute nonintractable  headache, unspecified headache type  Dehydration     Sharman CheekPhillip Shakinah Navis, MD 12/19/14 1335

## 2014-12-21 LAB — URINE CULTURE

## 2015-06-02 ENCOUNTER — Other Ambulatory Visit: Payer: Self-pay | Admitting: Obstetrics and Gynecology

## 2015-06-02 DIAGNOSIS — Z1231 Encounter for screening mammogram for malignant neoplasm of breast: Secondary | ICD-10-CM

## 2015-06-05 ENCOUNTER — Emergency Department: Payer: No Typology Code available for payment source

## 2015-06-05 ENCOUNTER — Emergency Department
Admission: EM | Admit: 2015-06-05 | Discharge: 2015-06-05 | Disposition: A | Discharge status: Home / Self Care | Payer: No Typology Code available for payment source | Attending: Emergency Medicine | Admitting: Emergency Medicine

## 2015-06-05 ENCOUNTER — Encounter: Payer: Self-pay | Admitting: Emergency Medicine

## 2015-06-05 DIAGNOSIS — R0602 Shortness of breath: Secondary | ICD-10-CM | POA: Diagnosis present

## 2015-06-05 LAB — CBC WITH DIFFERENTIAL/PLATELET
BASOS ABS: 0 10*3/uL (ref 0–0.1)
Eosinophils Absolute: 0.8 10*3/uL — ABNORMAL HIGH (ref 0–0.7)
HCT: 41.1 % (ref 35.0–47.0)
Hemoglobin: 13.8 g/dL (ref 12.0–16.0)
Lymphs Abs: 1.3 10*3/uL (ref 1.0–3.6)
MCH: 31.7 pg (ref 26.0–34.0)
MCHC: 33.6 g/dL (ref 32.0–36.0)
MCV: 94.3 fL (ref 80.0–100.0)
Monocytes Absolute: 0.7 10*3/uL (ref 0.2–0.9)
Monocytes Relative: 6 %
NEUTROS ABS: 8.1 10*3/uL — AB (ref 1.4–6.5)
Neutrophils Relative %: 75 %
PLATELETS: 322 10*3/uL (ref 150–440)
RBC: 4.36 MIL/uL (ref 3.80–5.20)
RDW: 13.2 % (ref 11.5–14.5)
WBC: 11 10*3/uL (ref 3.6–11.0)

## 2015-06-05 LAB — BASIC METABOLIC PANEL
ANION GAP: 8 (ref 5–15)
BUN: 12 mg/dL (ref 6–20)
CALCIUM: 8.8 mg/dL — AB (ref 8.9–10.3)
CO2: 23 mmol/L (ref 22–32)
CREATININE: 0.78 mg/dL (ref 0.44–1.00)
Chloride: 109 mmol/L (ref 101–111)
Glucose, Bld: 107 mg/dL — ABNORMAL HIGH (ref 65–99)
POTASSIUM: 3.6 mmol/L (ref 3.5–5.1)
Sodium: 140 mmol/L (ref 135–145)

## 2015-06-05 MED ORDER — IPRATROPIUM-ALBUTEROL 0.5-2.5 (3) MG/3ML IN SOLN
RESPIRATORY_TRACT | Status: AC
  Administered 2015-06-05: 3 mL via RESPIRATORY_TRACT
  Filled 2015-06-05: qty 3

## 2015-06-05 MED ORDER — AZITHROMYCIN 250 MG PO TABS: ORAL_TABLET | ORAL | Status: AC

## 2015-06-05 MED ORDER — ALBUTEROL SULFATE (2.5 MG/3ML) 0.083% IN NEBU: 5.0000 mg | INHALATION_SOLUTION | Freq: Once | RESPIRATORY_TRACT | Status: DC

## 2015-06-05 MED ORDER — IOPAMIDOL (ISOVUE-370) INJECTION 76%
100.0000 mL | Freq: Once | INTRAVENOUS | Status: AC | PRN
  Administered 2015-06-05: 75 mL via INTRAVENOUS
  Filled 2015-06-05: qty 100

## 2015-06-05 MED ORDER — PREDNISONE 20 MG PO TABS: 40.0000 mg | ORAL_TABLET | Freq: Every day | ORAL | Status: DC

## 2015-06-05 MED ORDER — IPRATROPIUM-ALBUTEROL 0.5-2.5 (3) MG/3ML IN SOLN
3.0000 mL | Freq: Once | RESPIRATORY_TRACT | Status: AC
  Administered 2015-06-05: 3 mL via RESPIRATORY_TRACT

## 2015-06-05 NOTE — ED Notes (Signed)
Pt presents to ED with reports of shortness of breath and cough. Pt reports has been having respiratory problems since February. Pt states she has been seen by PCP and urgent care and has been diagnosed with bronchitis and given inhalers and antibiotics and prednisone. Pt states she is getting worse instead of getting better.

## 2015-06-05 NOTE — Discharge Instructions (Signed)
Please seek medical attention for any high fevers, chest pain, shortness of breath, change in behavior, persistent vomiting, bloody stool or any other new or concerning symptoms. ° ° °Shortness of Breath °Shortness of breath means you have trouble breathing. Shortness of breath needs medical care right away. °HOME CARE  °· Do not smoke. °· Avoid being around chemicals or things (paint fumes, dust) that may bother your breathing. °· Rest as needed. Slowly begin your normal activities. °· Only take medicines as told by your doctor. °· Keep all doctor visits as told. °GET HELP RIGHT AWAY IF:  °· Your shortness of breath gets worse. °· You feel lightheaded, pass out (faint), or have a cough that is not helped by medicine. °· You cough up blood. °· You have pain with breathing. °· You have pain in your chest, arms, shoulders, or belly (abdomen). °· You have a fever. °· You cannot walk up stairs or exercise the way you normally do. °· You do not get better in the time expected. °· You have a hard time doing normal activities even with rest. °· You have problems with your medicines. °· You have any new symptoms. °MAKE SURE YOU: °· Understand these instructions. °· Will watch your condition. °· Will get help right away if you are not doing well or get worse. °  °This information is not intended to replace advice given to you by your health care provider. Make sure you discuss any questions you have with your health care provider. °  °Document Released: 06/30/2007 Document Revised: 01/16/2013 Document Reviewed: 03/29/2011 °Elsevier Interactive Patient Education ©2016 Elsevier Inc. ° °

## 2015-06-05 NOTE — ED Provider Notes (Signed)
[facility] Emergency Department Provider Note    ____________________________________________  Time seen: ~1645  I have reviewed the triage vital signs and the nursing notes.   HISTORY  Chief Complaint Cough and Shortness of Breath   History limited by: Not Limited   HPI Diane Smith is a 45 y.o. female who presents to the emergency department today because of concern of continued cough and shortness of breath. The patient states that these symptoms started roughly 3 months ago. She states that she has been seen multiple times for this. She has tried inhalers and steroids without any significant relief. She has not had any fevers. She does have chest pain. She states it is worse after she coughs. She denies any smoking history. Denies any other history of lung disease.   Past Medical History  Diagnosis Date  . Shingles   . Migraine     There are no active problems to display for this patient.   Past Surgical History  Procedure Laterality Date  . Cholecystectomy    . Novasure ablation    . Nasal sinus surgery    . Ankle surgery Left     with screw  . Lasik    . Hemorrhoid surgery    . Hemorrhoid surgery N/A 05/30/2014    Procedure: HEMORRHOIDECTOMY;  Surgeon: Renda Rolls, MD;  Location: [facility] ORS;  Service: General;  Laterality: N/A;    Current Outpatient Rx  Name  Route  Sig  Dispense  Refill  . cholecalciferol (VITAMIN D) 1000 UNITS tablet   Oral   Take 5,000 Units by mouth daily.         . diphenhydrAMINE (BENADRYL) 25 mg capsule   Oral   Take 1 capsule (25 mg total) by mouth every 6 (six) hours as needed.   30 capsule   0   . Fexofenadine-Pseudoephedrine (ALLEGRA-D 24 HOUR PO)   Oral   Take 1 tablet by mouth daily as needed. For allergies         . metoCLOPramide (REGLAN) 10 MG tablet   Oral   Take 1 tablet (10 mg total) by mouth 4 (four) times daily -  before meals and at bedtime.   60 tablet   0   .  oxyCODONE-acetaminophen (ROXICET) 5-325 MG per tablet   Oral   Take 1-2 tablets by mouth every 4 (four) hours as needed for severe pain.   30 tablet   0     Allergies Imodium a-d  Family History  Problem Relation Age of Onset  . Breast cancer Maternal Aunt 55  . Breast cancer Maternal Aunt   . Breast cancer Maternal Aunt     Social History Social History  Substance Use Topics  . Smoking status: Never Smoker   . Smokeless tobacco: None  . Alcohol Use: 0.0 oz/week    0 Glasses of wine per week    Review of Systems  Constitutional: Negative for fever. Cardiovascular: Negative for chest pain. Positive for shortness of breath Respiratory: Positive for shortness of breath. Positive for cough. Gastrointestinal: Negative for abdominal pain, vomiting and diarrhea. Neurological: Negative for headaches, focal weakness or numbness.  10-point ROS otherwise negative.  ____________________________________________   PHYSICAL EXAM:  VITAL SIGNS: ED Triage Vitals  Enc Vitals Group     BP 06/05/15 1352 143/81 mmHg     Pulse Rate 06/05/15 1352 119     Resp 06/05/15 1352 22     Temp 06/05/15 1352 98.2 F (36.8 C)  Temp Source 06/05/15 1352 Oral     SpO2 06/05/15 1352 96 %     Weight 06/05/15 1352 148 lb (67.132 kg)     Height 06/05/15 1352 5\' 2"  (1.575 m)   Constitutional: Alert and oriented. Well appearing and in no distress. Occasional cough. Eyes: Conjunctivae are normal. PERRL. Normal extraocular movements. ENT   Head: Normocephalic and atraumatic.   Nose: No congestion/rhinnorhea.   Mouth/Throat: Mucous membranes are moist.   Neck: No stridor. Hematological/Lymphatic/Immunilogical: No cervical lymphadenopathy. Cardiovascular: Normal rate, regular rhythm.  No murmurs, rubs, or gallops. Respiratory: Normal respiratory effort without tachypnea nor retractions. Bilateral expiratory wheezing.  Gastrointestinal: Soft and nontender. No  distention. Genitourinary: Deferred Musculoskeletal: Normal range of motion in all extremities. No joint effusions.  No lower extremity tenderness nor edema. Neurologic:  Normal speech and language. No gross focal neurologic deficits are appreciated.  Skin:  Skin is warm, dry and intact. No rash noted. Psychiatric: Mood and affect are normal. Speech and behavior are normal. Patient exhibits appropriate insight and judgment.  ____________________________________________    LABS (pertinent positives/negatives)  Labs Reviewed  CBC WITH DIFFERENTIAL/PLATELET - Abnormal; Notable for the following:    Neutro Abs 8.1 (*)    Eosinophils Absolute 0.8 (*)    All other components within normal limits  BASIC METABOLIC PANEL - Abnormal; Notable for the following:    Glucose, Bld 107 (*)    Calcium 8.8 (*)    All other components within normal limits     ____________________________________________   EKG  I, Phineas SemenGraydon Kahlil Cowans, attending physician, personally viewed and interpreted this EKG  EKG Time: 1346 Rate: 109 Rhythm: sinus tachycardia Axis: normal Intervals: qtc 398 QRS: narrow ST changes: no st elevation Impression: abnormal ekg  ____________________________________________    RADIOLOGY  CXR  IMPRESSION: No acute cardiopulmonary process. Mild chronic central airway Thickening.  CT angio IMPRESSION: No demonstrable pulmonary embolus. No edema or consolidation. No appreciable adenopathy. There is a degree of hepatic steatosis. Gallbladder is absent.  ____________________________________________   PROCEDURES  Procedure(s) performed: None  Critical Care performed: No  ____________________________________________   INITIAL IMPRESSION / ASSESSMENT AND PLAN / ED COURSE  Pertinent labs & imaging results that were available during my care of the patient were reviewed by me and considered in my medical decision making (see chart for details).  Patient presents  to the emergency department today with continued cough, shortness breath and chest pain. On exam she does have some bilateral wheezing. Patient without any history of COPD or smoking. This x-ray without any concerning findings. Given the constellation of symptoms however in no significant improvement after months will check a CTA to look for chronic PEs.  ----------------------------------------- 7:03 PM on 06/05/2015 -----------------------------------------  CT angiogram without any concerning signs for pulmonary embolism. Additionally no signs of pneumonia. Discussed these findings with patient. Will plan on discharging home to follow with primary care.  ____________________________________________   FINAL CLINICAL IMPRESSION(S) / ED DIAGNOSES  Final diagnoses:  Shortness of breath      Phineas SemenGraydon Durwood Dittus, MD 06/05/15 1921

## 2015-06-16 ENCOUNTER — Emergency Department: Payer: No Typology Code available for payment source

## 2015-06-16 ENCOUNTER — Emergency Department
Admission: EM | Admit: 2015-06-16 | Discharge: 2015-06-16 | Disposition: A | Discharge status: Home / Self Care | Payer: No Typology Code available for payment source | Attending: Emergency Medicine | Admitting: Emergency Medicine

## 2015-06-16 DIAGNOSIS — R0789 Other chest pain: Secondary | ICD-10-CM | POA: Diagnosis not present

## 2015-06-16 DIAGNOSIS — R0602 Shortness of breath: Secondary | ICD-10-CM | POA: Diagnosis present

## 2015-06-16 DIAGNOSIS — Z79899 Other long term (current) drug therapy: Secondary | ICD-10-CM | POA: Insufficient documentation

## 2015-06-16 DIAGNOSIS — R079 Chest pain, unspecified: Secondary | ICD-10-CM

## 2015-06-16 LAB — CBC
HCT: 41.2 % (ref 35.0–47.0)
Hemoglobin: 14.1 g/dL (ref 12.0–16.0)
MCH: 32 pg (ref 26.0–34.0)
MCHC: 34.3 g/dL (ref 32.0–36.0)
MCV: 93.5 fL (ref 80.0–100.0)
Platelets: 293 10*3/uL (ref 150–440)
RBC: 4.4 MIL/uL (ref 3.80–5.20)
RDW: 13.5 % (ref 11.5–14.5)
WBC: 7.1 10*3/uL (ref 3.6–11.0)

## 2015-06-16 LAB — HEPATIC FUNCTION PANEL
ALT: 16 U/L (ref 14–54)
AST: 17 U/L (ref 15–41)
Albumin: 3.9 g/dL (ref 3.5–5.0)
Alkaline Phosphatase: 69 U/L (ref 38–126)
Bilirubin, Direct: <0.1 mg/dL — ABNORMAL LOW (ref 0.1–0.5)
TOTAL PROTEIN: 6.7 g/dL (ref 6.5–8.1)
Total Bilirubin: 0.7 mg/dL (ref 0.3–1.2)

## 2015-06-16 LAB — BASIC METABOLIC PANEL
ANION GAP: 5 (ref 5–15)
BUN: 17 mg/dL (ref 6–20)
CALCIUM: 8.8 mg/dL — AB (ref 8.9–10.3)
CO2: 23 mmol/L (ref 22–32)
Chloride: 108 mmol/L (ref 101–111)
Creatinine, Ser: 0.7 mg/dL (ref 0.44–1.00)
GFR calc Af Amer: >60 mL/min (ref >60)
GLUCOSE: 104 mg/dL — AB (ref 65–99)
Potassium: 3.6 mmol/L (ref 3.5–5.1)
Sodium: 136 mmol/L (ref 135–145)

## 2015-06-16 LAB — TROPONIN I

## 2015-06-16 MED ORDER — OXYCODONE-ACETAMINOPHEN 5-325 MG PO TABS
1.0000 | ORAL_TABLET | Freq: Once | ORAL | Status: AC
  Administered 2015-06-16: 1 via ORAL
  Filled 2015-06-16: qty 1

## 2015-06-16 MED ORDER — IPRATROPIUM-ALBUTEROL 0.5-2.5 (3) MG/3ML IN SOLN
3.0000 mL | Freq: Once | RESPIRATORY_TRACT | Status: AC
  Administered 2015-06-16: 3 mL via RESPIRATORY_TRACT

## 2015-06-16 MED ORDER — NAPROXEN 500 MG PO TABS: 500.0000 mg | ORAL_TABLET | Freq: Two times a day (BID) | ORAL | Status: AC | PRN

## 2015-06-16 MED ORDER — IPRATROPIUM-ALBUTEROL 0.5-2.5 (3) MG/3ML IN SOLN
RESPIRATORY_TRACT | Status: AC
  Administered 2015-06-16: 3 mL via RESPIRATORY_TRACT
  Filled 2015-06-16: qty 3

## 2015-06-16 MED ORDER — KETOROLAC TROMETHAMINE 30 MG/ML IJ SOLN
60.0000 mg | Freq: Once | INTRAMUSCULAR | Status: AC
  Administered 2015-06-16: 60 mg via INTRAMUSCULAR
  Filled 2015-06-16: qty 2

## 2015-06-16 NOTE — ED Provider Notes (Signed)
[facility] Emergency Department Provider Note   ____________________________________________  Time seen: Approximately 8:20 AM I have reviewed the triage vital signs and the triage nursing note.  HISTORY  Chief Complaint Chest Pain and Shortness of Breath   Historian Patient and spouse at the bedside  HPI Diane Smith is a 45 y.o. female presenting today to the emergency department complaining of several days of chest pain and coughing. Symptoms have been ongoing but waxing and waning for several months now, starting back in February. She's had several evaluations, most recent of which patient was evaluated in the emergency department about a week and half ago. She had a little bit of improvement, but symptoms were somewhat worse starting on Friday. She states that she has had ongoing wheezing, but does not previously carry a diagnosis of asthma or COPD or other lung disease. She does have an appointment with a pulmonologist, Dr. Mayo Ao, for tomorrow, but because of the chest discomfort felt like she couldn't wait until tomorrow.  She is still having problems with wheezing. She states that previously she was told that she might have pulled a muscle from coughing. Today she is having pain more so on the right side of the chest wall and it hurts when she moves her trunk. No reported abdominal pain, or GI symptoms. She does report a history of prior cholecystectomy.  She has not had a workup with a cardiologist.  She denies being a "worrier "or having extreme stress.  Her pain is considered moderate to severe.    Past Medical History  Diagnosis Date  . Shingles   . Migraine     There are no active problems to display for this patient.   Past Surgical History  Procedure Laterality Date  . Cholecystectomy    . Novasure ablation    . Nasal sinus surgery    . Ankle surgery Left     with screw  . Lasik    . Hemorrhoid surgery    . Hemorrhoid  surgery N/A 05/30/2014    Procedure: HEMORRHOIDECTOMY;  Surgeon: Renda Rolls, MD;  Location: [facility] ORS;  Service: General;  Laterality: N/A;    Current Outpatient Rx  Name  Route  Sig  Dispense  Refill  . albuterol (PROAIR HFA) 108 (90 Base) MCG/ACT inhaler   Inhalation   Inhale 2 puffs into the lungs every 4 (four) hours as needed.         . beclomethasone (QVAR) 80 MCG/ACT inhaler   Inhalation   Inhale 2 puffs into the lungs daily.         . diphenhydrAMINE (BENADRYL) 25 mg capsule   Oral   Take 1 capsule (25 mg total) by mouth every 6 (six) hours as needed.   30 capsule   0   . Fexofenadine-Pseudoephedrine (ALLEGRA-D 24 HOUR PO)   Oral   Take 1 tablet by mouth daily as needed. For allergies         . oxyCODONE-acetaminophen (ROXICET) 5-325 MG per tablet   Oral   Take 1-2 tablets by mouth every 4 (four) hours as needed for severe pain.   30 tablet   0   . metoCLOPramide (REGLAN) 10 MG tablet   Oral   Take 1 tablet (10 mg total) by mouth 4 (four) times daily -  before meals and at bedtime. Patient not taking: Reported on 06/16/2015   60 tablet   0   . naproxen (NAPROSYN) 500 MG tablet   Oral  Take 1 tablet (500 mg total) by mouth 2 (two) times daily as needed for moderate pain.   14 tablet   0   . predniSONE (DELTASONE) 20 MG tablet   Oral   Take 2 tablets (40 mg total) by mouth daily. Patient not taking: Reported on 06/16/2015   10 tablet   0     Allergies Imodium a-d  Family History  Problem Relation Age of Onset  . Breast cancer Maternal Aunt 55  . Breast cancer Maternal Aunt   . Breast cancer Maternal Aunt     Social History Social History  Substance Use Topics  . Smoking status: Never Smoker   . Smokeless tobacco: Not on file  . Alcohol Use: 0.0 oz/week    0 Glasses of wine per week    Review of Systems  Constitutional: Negative for fever. Eyes: Negative for visual changes. ENT: Negative for sore throat. Cardiovascular: Positive for  chest pain. Respiratory: Positive for shortness of breath. Gastrointestinal: Negative for abdominal pain, vomiting and diarrhea. Genitourinary: Negative for dysuria. Musculoskeletal: Negative for back pain. Skin: Negative for rash. Neurological: Negative for headache. 10 point Review of Systems otherwise negative ____________________________________________   PHYSICAL EXAM:  VITAL SIGNS: ED Triage Vitals  Enc Vitals Group     BP 06/16/15 0605 141/67 mmHg     Pulse Rate 06/16/15 0605 103     Resp 06/16/15 0605 26     Temp 06/16/15 0605 98.6 F (37 C)     Temp Source 06/16/15 0605 Oral     SpO2 06/16/15 0605 99 %     Weight 06/16/15 0603 149 lb (67.586 kg)     Height 06/16/15 0603 5\' 2"  (1.575 m)     Head Cir --      Peak Flow --      Pain Score 06/16/15 0603 8     Pain Loc --      Pain Edu? --      Excl. in GC? --      Constitutional: Alert and oriented. Well appearingOverall but occasionally wincing especially when I asked her to sit forward and engage the chest wall muscles.Marland Kitchen. HEENT   Head: Normocephalic and atraumatic.      Eyes: Conjunctivae are normal. PERRL. Normal extraocular movements.      Ears:         Nose: No congestion/rhinnorhea.   Mouth/Throat: Mucous membranes are moist.   Neck: No stridor. Cardiovascular/Chest: Normal rate, regular rhythm.  No murmurs, rubs, or gallops. Chest wall soft tissues just above the breast on the right side are tender to palpation. Lateral and AP compression or not particularly tender. There is no skin rash across the chest. Although I did not palpate the breast tissue itself, she is not reporting pain in the breast. Respiratory: Normal respiratory effort without tachypnea nor retractions. After 1 DuoNeb treatment, mild decreased air movement throughout all fields, with no significant wheezing, rales or rhonchi at this point. Gastrointestinal: Soft. No distention, no guarding, no rebound. Nontender.   Genitourinary/rectal:Deferred Musculoskeletal: Nontender with normal range of motion in all extremities. No joint effusions.  No lower extremity tenderness.  No edema. Neurologic:  Normal speech and language. No gross or focal neurologic deficits are appreciated. Skin:  Skin is warm, dry and intact. No rash noted. Psychiatric: Mood and affect are normal. Speech and behavior are normal. Patient exhibits appropriate insight and judgment.  ____________________________________________   EKG I, Governor Rooksebecca Sherlene Rickel, MD, the attending physician have personally viewed and interpreted  all ECGs.  95 beats per minute. Normal sinus rhythm. Narrow QRS. Normal axis. Normal ST and T-wave ____________________________________________  LABS (pertinent positives/negatives)  Labs Reviewed  BASIC METABOLIC PANEL - Abnormal; Notable for the following:    Glucose, Bld 104 (*)    Calcium 8.8 (*)    All other components within normal limits  HEPATIC FUNCTION PANEL - Abnormal; Notable for the following:    Bilirubin, Direct <0.1 (*)    All other components within normal limits  CBC  TROPONIN I      ____________________________________________  RADIOLOGY All Xrays were viewed by me. Imaging interpreted by Radiologist.  Chest two-view: Stable chronic central bronchial thickening. No acute process. __________________________________________  PROCEDURES  Procedure(s) performed: None  Critical Care performed: None  ____________________________________________   ED COURSE / ASSESSMENT AND PLAN  Pertinent labs & imaging results that were available during my care of the patient were reviewed by me and considered in my medical decision making (see chart for details).   There started patient on DuoNeb by protocol for some clinical wheezing.  She is complaining of chest pain that sounds most like musculoskeletal, given that her right pectoral region is tender to palpation and is made worse when she  moves her trunk or tries to sit up.  It sounds like this type of pain and shortness of breath/wheezing has been waxing and waning for several months without a complete resolution before seems to come back again.  I reviewed her ED evaluation from 06/05/2015 with Dr. Derrill Kay, where it sounds like the complaint was very similar to today. At that time she did end up getting a CT scan to rule out pulmonary embolism and I reviewed the results of that and there is no acute finding.  She does have an appointment scheduled with pulmonology, Dr. Mayo Ao, for tomorrow. I do think this is very important to try to figure out the underlying issue with the wheezing, including restrictive or obstructive pattern etc.  Additionally, it sounds like she has not seen a cardiologist, and although I think it is pretty unlikely that her symptoms are coming from a primary cardiac issue, I do think it is worthwhile to go ahead at this point with ongoing symptoms have her evaluated by cardiologist. She will be given the office number for Dr. Park Breed.  Her EKG and labs and chest x-ray are reassuring today.  Again clinically it seems like she is having chest wall discomfort likely due to some sort of inflammation. I tried dose of Toradol here and will discharge her with Naprosyn.   She reported some but not much improvement. I discussed with her that her pain is starting to be potentially at the level of chronic pain, and some little uncomfortable prescribing narcotics. I will give her dose of Percocet here.  She has multiple avenues for follow-up including pulmonology, cardiology, and after discussed with her primary care physician even potential autoimmune investigation.    CONSULTATIONS:   None  Patient / Family / Caregiver informed of clinical course, medical decision-making process, and agree with plan.   I discussed return precautions, follow-up instructions, and discharged instructions with patient and/or  family.   ___________________________________________   FINAL CLINICAL IMPRESSION(S) / ED DIAGNOSES   Final diagnoses:  Chest pain, unspecified chest pain type              Note: This dictation was prepared with Dragon dictation. Any transcriptional errors that result from this process are unintentional   Governor Rooks, MD  06/16/15 1258 

## 2015-06-16 NOTE — Discharge Instructions (Signed)
You were evaluated for chest pain, for which no certain cause was found, but your exam and evaluation are reassuring in the emergency department today. As we discussed, I am most suspicious that your pain is coming from a musculoskeletal/chest wall source and are being treated with an anti-inflammatory medication called Naprosyn to help with pain and inflammation.  As we discussed, because of the ongoing issue with wheezing, be sure to keep your appointment with the pulmonologist tomorrow.  We also discussed, make an appointment with cardiologist, Dr. Santo HeldKahn's office number is provided, for further evaluation of nonspecific chest pain including the possibility of stress testing.  We also discussed the possibility of potential inflammatory or autoimmune sources. Discussed with your primary care physician for further workup from this standpoint potentially.  Return to the emergency room for any worsening condition including new or worsening chest pain, trouble breathing, vomiting, black or bloody stools, weakness or numbness, dizziness or passing out, or any other symptoms concerning to you.   Chest Wall Pain Chest wall pain is pain in or around the bones and muscles of your chest. Sometimes, an injury causes this pain. Sometimes, the cause may not be known. This pain may take several weeks or longer to get better. HOME CARE Pay attention to any changes in your symptoms. Take these actions to help with your pain:  Rest as told by your doctor.  Avoid activities that cause pain. Try not to use your chest, belly (abdominal), or side muscles to lift heavy things.  If directed, apply ice to the painful area:  Put ice in a plastic bag.  Place a towel between your skin and the bag.  Leave the ice on for 20 minutes, 2-3 times per day.  Take over-the-counter and prescription medicines only as told by your doctor.  Do not use tobacco products, including cigarettes, chewing tobacco, and e-cigarettes.  If you need help quitting, ask your doctor.  Keep all follow-up visits as told by your doctor. This is important. GET HELP IF:  You have a fever.  Your chest pain gets worse.  You have new symptoms. GET HELP RIGHT AWAY IF:  You feel sick to your stomach (nauseous) or you throw up (vomit).  You feel sweaty or light-headed.  You have a cough with phlegm (sputum) or you cough up blood.  You are short of breath.   This information is not intended to replace advice given to you by your health care provider. Make sure you discuss any questions you have with your health care provider.   Document Released: 06/30/2007 Document Revised: 10/02/2014 Document Reviewed: 04/08/2014 Elsevier Interactive Patient Education 2016 [employer] Inc[employer]  Nonspecific Chest Pain It is often hard to find the cause of chest pain. There is always a chance that your pain could be related to something serious, such as a heart attack or a blood clot in your lungs. Chest pain can also be caused by conditions that are not life-threatening. If you have chest pain, it is very important to follow up with your doctor.  HOME CARE  If you were prescribed an antibiotic medicine, finish it all even if you start to feel better.  Avoid any activities that cause chest pain.  Do not use any tobacco products, including cigarettes, chewing tobacco, or electronic cigarettes. If you need help quitting, ask your doctor.  Do not drink alcohol.  Take medicines only as told by your doctor.  Keep all follow-up visits as told by your doctor. This is important.  This includes any further testing if your chest pain does not go away.  Your doctor may tell you to keep your head raised (elevated) while you sleep.  Make lifestyle changes as told by your doctor. These may include:  Getting regular exercise. Ask your doctor to suggest some activities that are safe for you.  Eating a heart-healthy diet. Your doctor or a diet specialist  (dietitian) can help you to learn healthy eating options.  Maintaining a healthy weight.  Managing diabetes, if necessary.  Reducing stress. GET HELP IF:  Your chest pain does not go away, even after treatment.  You have a rash with blisters on your chest.  You have a fever. GET HELP RIGHT AWAY IF:  Your chest pain is worse.  You have an increasing cough, or you cough up blood.  You have severe belly (abdominal) pain.  You feel extremely weak.  You pass out (faint).  You have chills.  You have sudden, unexplained chest discomfort.  You have sudden, unexplained discomfort in your arms, back, neck, or jaw.  You have shortness of breath at any time.  You suddenly start to sweat, or your skin gets clammy.  You feel nauseous.  You vomit.  You suddenly feel light-headed or dizzy.  Your heart begins to beat quickly, or it feels like it is skipping beats. These symptoms may be an emergency. Do not wait to see if the symptoms will go away. Get medical help right away. Call your local emergency services (911 in the [place] Do not drive yourself to the hospital.   This information is not intended to replace advice given to you by your health care provider. Make sure you discuss any questions you have with your health care provider.   Document Released: 06/30/2007 Document Revised: 02/01/2014 Document Reviewed: 08/17/2013 Elsevier Interactive Patient Education [employer]

## 2015-06-16 NOTE — ED Notes (Signed)
Patient reports she has been having chest pain with coughing since Friday.  Reports now pain and feeling short of breath worse.  Patient reports chest pain mid sternal and radiates through to her back.

## 2015-06-17 ENCOUNTER — Ambulatory Visit
Admission: RE | Admit: 2015-06-17 | Discharge: 2015-06-17 | Disposition: A | Discharge status: Home / Self Care | Payer: No Typology Code available for payment source | Source: Ambulatory Visit | Attending: Obstetrics and Gynecology | Admitting: Obstetrics and Gynecology

## 2015-06-17 DIAGNOSIS — Z1231 Encounter for screening mammogram for malignant neoplasm of breast: Secondary | ICD-10-CM | POA: Insufficient documentation

## 2016-04-08 IMAGING — CT CT HEAD W/O CM
1 series · 16 of 29 positions shown, 20 images · non-contrast
Comparison: 04/01/2010

CLINICAL DATA: 3 recent episodes of near syncope/ syncope since
hemorrhoid surgery 2 days ago. No reported head injury.

EXAM:
CT HEAD WITHOUT CONTRAST
TECHNIQUE: Contiguous axial images were obtained from the base of the skull
through the vertex without intravenous contrast.

[Series 2: soft tissue · axial · 0.47mm/px · z∈[-176,-46]mm · 16 of 29 slices shown, 20 images]
[im 2/29  brain]
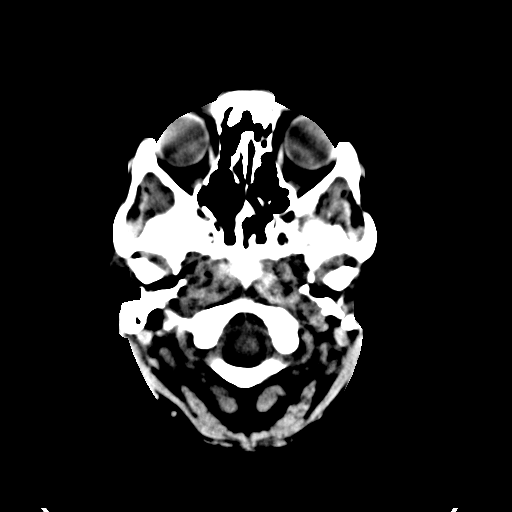
[im 2/29  bone]
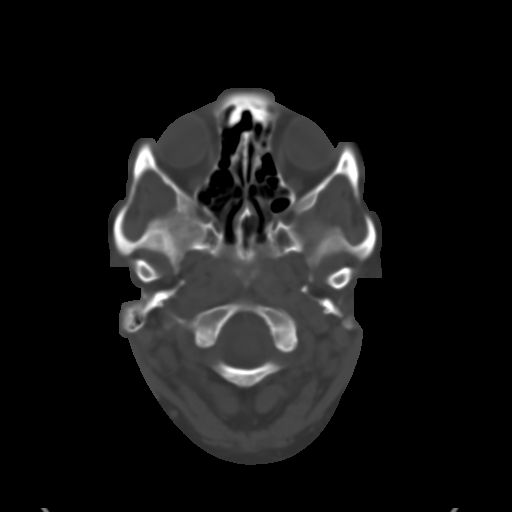
[im 4/29  brain]
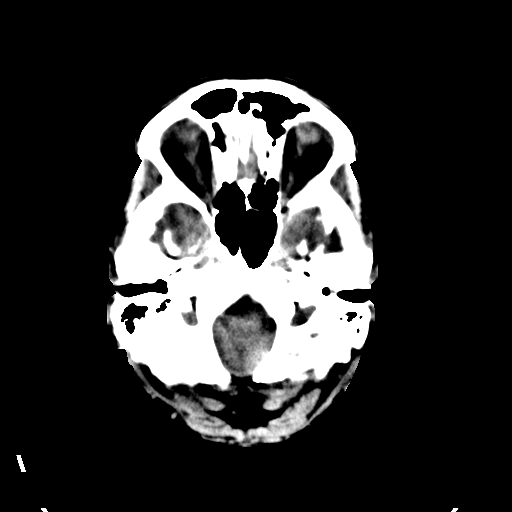
[im 6/29  brain]
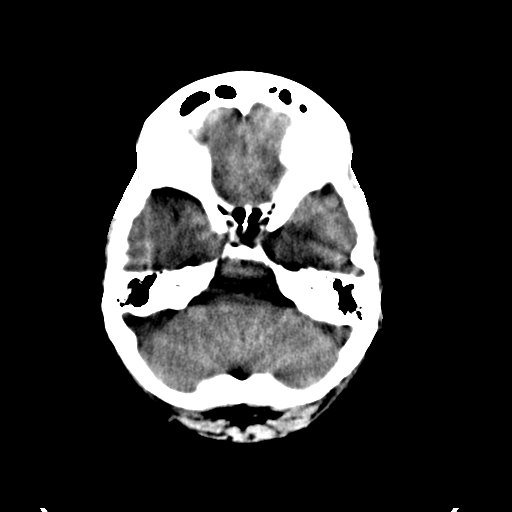
[im 7/29  brain]
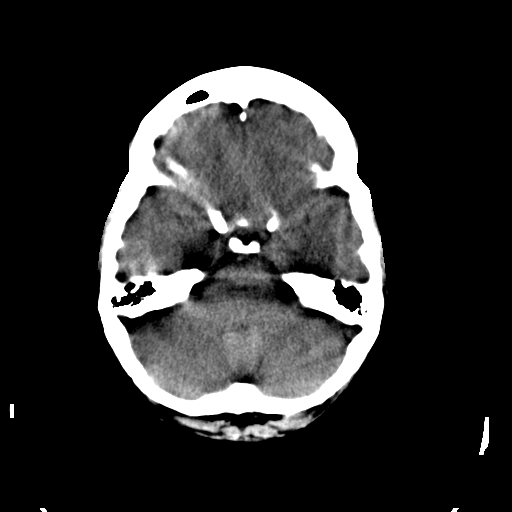
[im 9/29  brain]
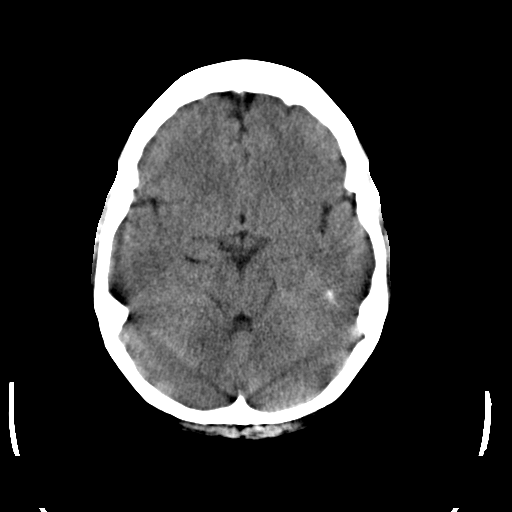
[im 9/29  bone]
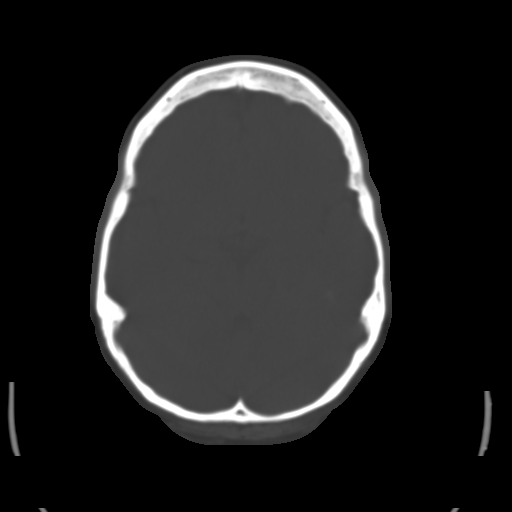
[im 11/29  brain]
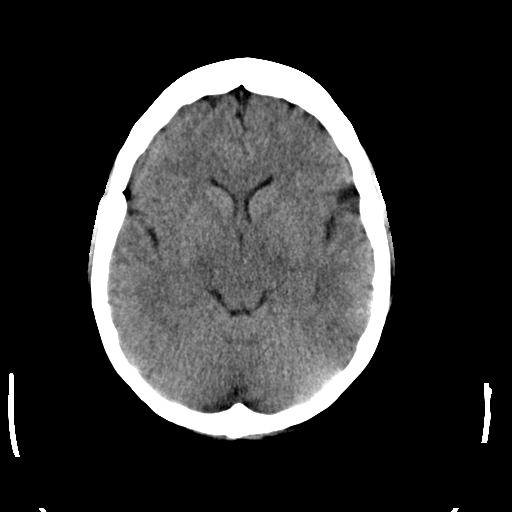
[im 12/29  brain]
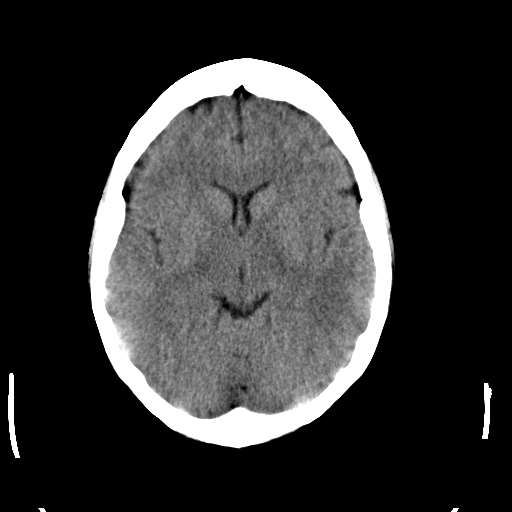
[im 14/29  brain]
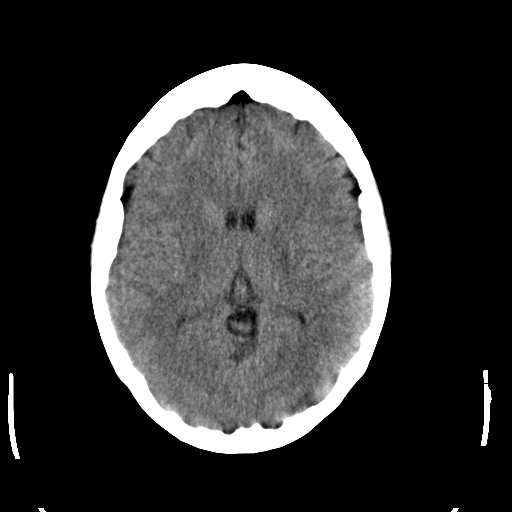
[im 16/29  brain]
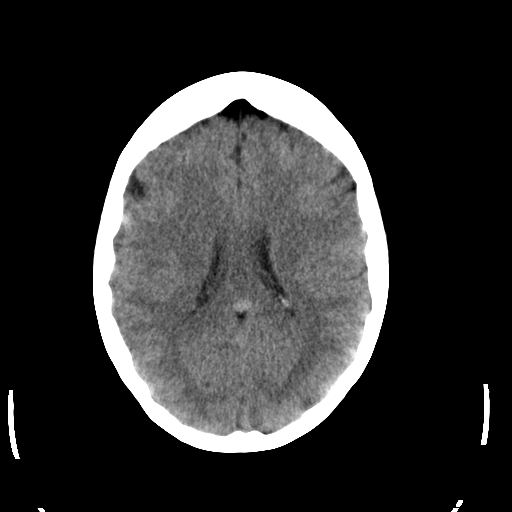
[im 16/29  bone]
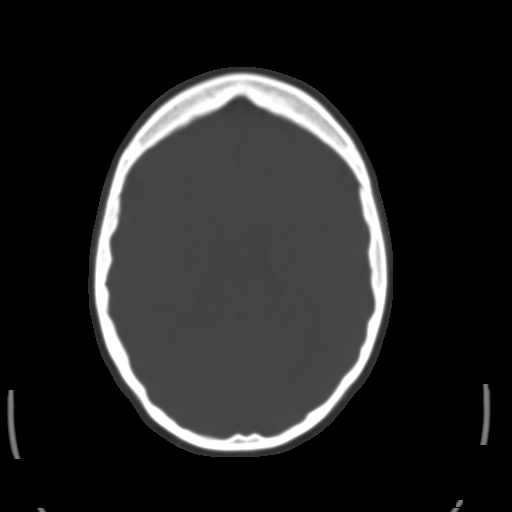
[im 18/29  brain]
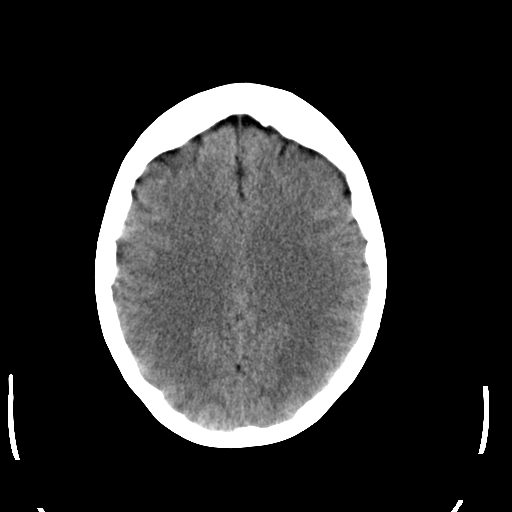
[im 19/29  brain]
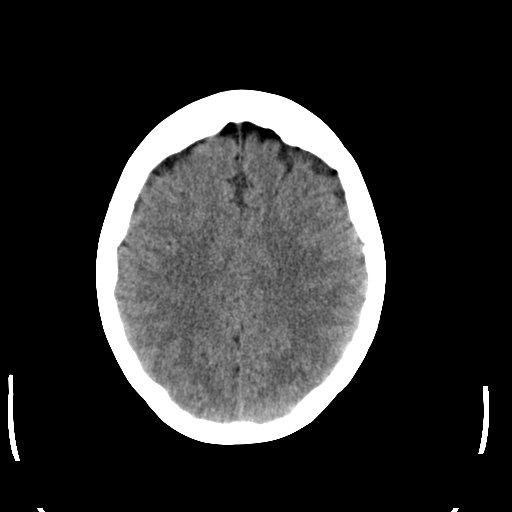
[im 21/29  brain]
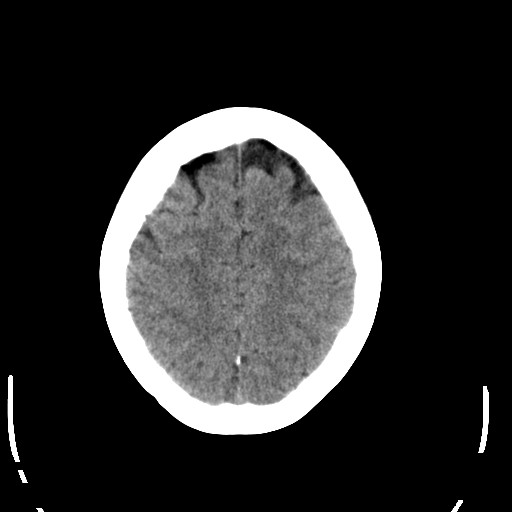
[im 23/29  brain]
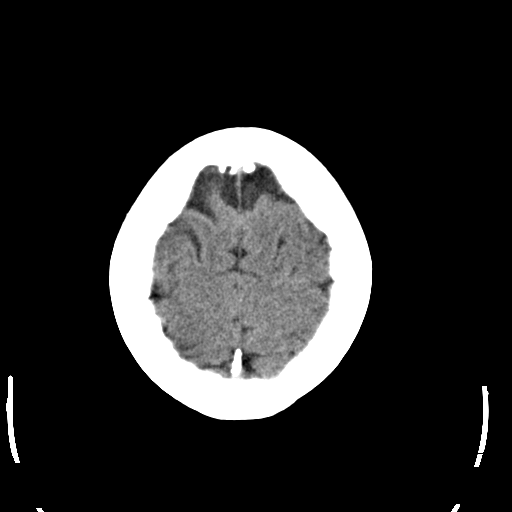
[im 23/29  bone]
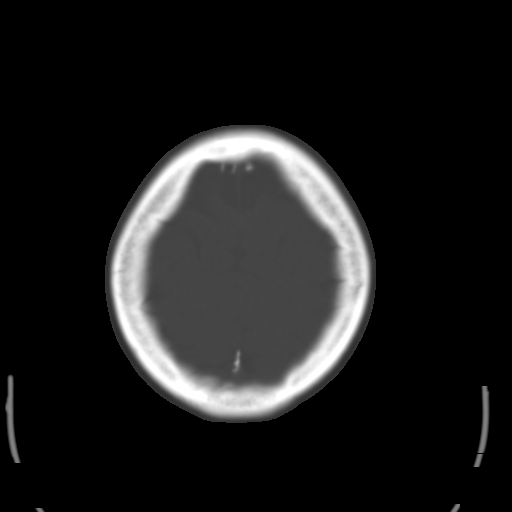
[im 24/29  brain]
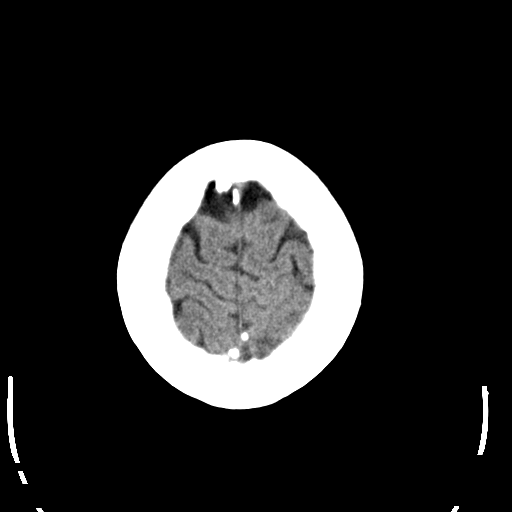
[im 26/29  brain]
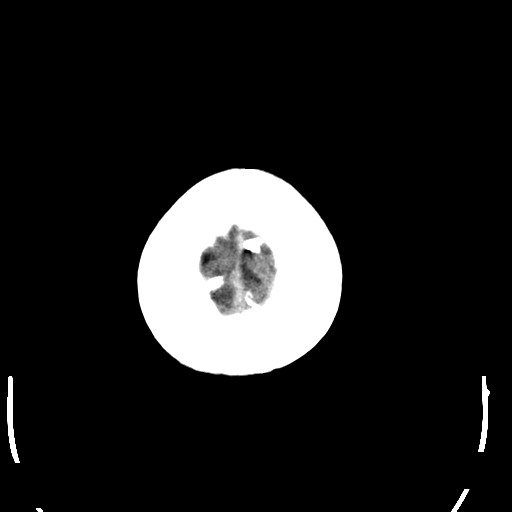
[im 28/29  brain]
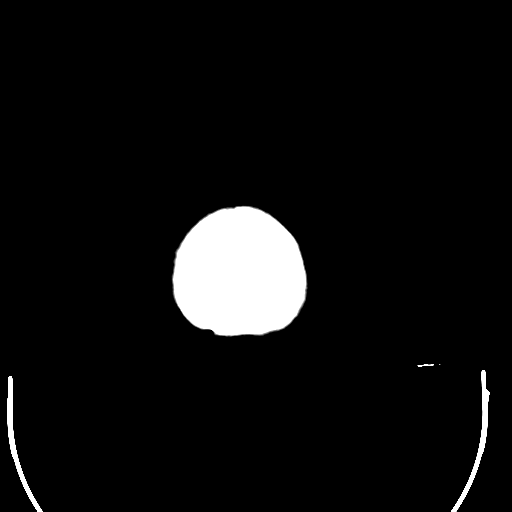

[16 of 29 positions shown; findings below may reference images not displayed]

FINDINGS: The ventricles and sulci are within normal limits for age. There is
no evidence of acute infarct, intracranial hemorrhage, mass, midline
shift, or extra-axial collection.

The orbits are unremarkable. The visualized paranasal sinuses and
mastoid air cells are clear. No skull fracture is identified.
IMPRESSION: Unremarkable head CT.

## 2016-06-02 ENCOUNTER — Other Ambulatory Visit: Payer: Self-pay | Admitting: Nurse Practitioner

## 2016-06-02 DIAGNOSIS — Z1231 Encounter for screening mammogram for malignant neoplasm of breast: Secondary | ICD-10-CM

## 2016-06-25 ENCOUNTER — Ambulatory Visit
Admission: RE | Admit: 2016-06-25 | Discharge: 2016-06-25 | Disposition: A | Discharge status: Home / Self Care | Payer: No Typology Code available for payment source | Source: Ambulatory Visit | Attending: Nurse Practitioner | Admitting: Nurse Practitioner

## 2016-06-25 DIAGNOSIS — Z1231 Encounter for screening mammogram for malignant neoplasm of breast: Secondary | ICD-10-CM | POA: Diagnosis not present

## 2016-09-21 ENCOUNTER — Ambulatory Visit: Payer: Self-pay

## 2016-09-21 ENCOUNTER — Ambulatory Visit (INDEPENDENT_AMBULATORY_CARE_PROVIDER_SITE_OTHER): Payer: No Typology Code available for payment source

## 2016-09-21 ENCOUNTER — Ambulatory Visit (INDEPENDENT_AMBULATORY_CARE_PROVIDER_SITE_OTHER): Payer: No Typology Code available for payment source | Admitting: Podiatry

## 2016-09-21 DIAGNOSIS — M659 Synovitis and tenosynovitis, unspecified: Secondary | ICD-10-CM

## 2016-09-21 DIAGNOSIS — Z8781 Personal history of (healed) traumatic fracture: Secondary | ICD-10-CM

## 2016-09-21 NOTE — Progress Notes (Signed)
   Subjective:    Patient ID: Diane Smith, female    DOB: November 28, 1970, 46 y.o.   MRN: [id number]  HPI    Review of Systems  All other systems reviewed and are negative.      Objective:   Physical Exam        Assessment & Plan:

## 2016-09-27 MED ORDER — BETAMETHASONE SOD PHOS & ACET 6 (3-3) MG/ML IJ SUSP: 3.0000 mg | Freq: Once | INTRAMUSCULAR | Status: DC

## 2016-09-27 NOTE — Progress Notes (Signed)
Patient ID: Diane Smith, female   DOB: 1970/11/26, 46 y.o.   MRN: [id number]   HPI: 46 year old female presents to the office today for left lateral pain with swelling. This is been on and off for the past year approximately. Patient does have a history of an ankle fracture to the left lower extremity several years prior. Surgical ORIF was performed at that time. She denies any recent trauma or change in foot structure or daily activity.   Physical Exam: General: The patient is alert and oriented x3 in no acute distress.  Dermatology: Skin is warm, dry and supple bilateral lower extremities. Negative for open lesions or macerations.  Vascular: Palpable pedal pulses bilaterally. No  erythema noted. Capillary refill within normal limits.  Neurological: Epicritic and protective threshold grossly intact bilaterally.   Musculoskeletal Exam: There is a moderate pain on palpation noted to the left ankle joint laterally. Mild edema noted. Range of motion within normal limits to all pedal and ankle joints bilateral. Muscle strength 5/5 in all groups bilateral.   Radiographic Exam:  Normal osseous mineralization. Joint spaces preserved. No fracture/dislocation/boney destruction.    Assessment: 1. Posttraumatic arthrosis left ankle joint 2. Ankle joint synovitis left   Plan of Care:  1. Patient was evaluated. X-rays reviewed today 2. Injection of 0.5 mL Celestone Soluspan injected in the lateral aspect of the patient's left ankle joint 3. Compression anklet dispensed for edema 4. Return to clinic in 4 weeks   Felecia ShellingBrent M. Dianelys Scinto, DPM [facility]  Dr. Felecia ShellingBrent M. Oluwatomisin Hustead, DPM    [address]                                        [place], [place] [id number]                Office [phone]  Fax [phone]) [phone]

## 2016-09-28 ENCOUNTER — Other Ambulatory Visit: Payer: Self-pay

## 2016-09-28 MED ORDER — IBUPROFEN-FAMOTIDINE 800-26.6 MG PO TABS: 1.0000 | ORAL_TABLET | Freq: Three times a day (TID) | ORAL | 2 refills | Status: DC

## 2016-09-28 NOTE — Progress Notes (Unsigned)
Per Dr. Logan BoresEvans, ok to prescribe Duexis.  [facility]

## 2017-04-05 ENCOUNTER — Other Ambulatory Visit: Payer: Self-pay

## 2017-04-05 ENCOUNTER — Emergency Department: Payer: 59

## 2017-04-05 ENCOUNTER — Emergency Department
Admission: EM | Admit: 2017-04-05 | Discharge: 2017-04-06 | Disposition: A | Discharge status: Home / Self Care | Payer: 59 | Attending: Emergency Medicine | Admitting: Emergency Medicine

## 2017-04-05 ENCOUNTER — Encounter: Payer: Self-pay | Admitting: Radiology

## 2017-04-05 DIAGNOSIS — K529 Noninfective gastroenteritis and colitis, unspecified: Secondary | ICD-10-CM | POA: Insufficient documentation

## 2017-04-05 DIAGNOSIS — Z79899 Other long term (current) drug therapy: Secondary | ICD-10-CM | POA: Diagnosis not present

## 2017-04-05 DIAGNOSIS — R1084 Generalized abdominal pain: Secondary | ICD-10-CM | POA: Diagnosis present

## 2017-04-05 LAB — PREGNANCY, URINE: PREG TEST UR: NEGATIVE

## 2017-04-05 LAB — COMPREHENSIVE METABOLIC PANEL
ALBUMIN: 4.2 g/dL (ref 3.5–5.0)
ALT: 27 U/L (ref 14–54)
ANION GAP: 11 (ref 5–15)
AST: 33 U/L (ref 15–41)
Alkaline Phosphatase: 62 U/L (ref 38–126)
BUN: 9 mg/dL (ref 6–20)
CALCIUM: 8.4 mg/dL — AB (ref 8.9–10.3)
CO2: 26 mmol/L (ref 22–32)
CREATININE: 0.56 mg/dL (ref 0.44–1.00)
Chloride: 99 mmol/L — ABNORMAL LOW (ref 101–111)
GFR calc Af Amer: >60 mL/min (ref >60)
GFR calc non Af Amer: >60 mL/min (ref >60)
GLUCOSE: 97 mg/dL (ref 65–99)
Potassium: 2.7 mmol/L — CL (ref 3.5–5.1)
SODIUM: 136 mmol/L (ref 135–145)
Total Bilirubin: 1.1 mg/dL (ref 0.3–1.2)
Total Protein: 7.6 g/dL (ref 6.5–8.1)

## 2017-04-05 LAB — URINALYSIS, COMPLETE (UACMP) WITH MICROSCOPIC
BACTERIA UA: NONE SEEN
Bilirubin Urine: NEGATIVE
Glucose, UA: NEGATIVE mg/dL
Ketones, ur: 20 mg/dL — AB
NITRITE: NEGATIVE
PROTEIN: NEGATIVE mg/dL
Specific Gravity, Urine: 1.01 (ref 1.005–1.030)
pH: 6 (ref 5.0–8.0)

## 2017-04-05 LAB — CBC
HCT: 43.8 % (ref 35.0–47.0)
Hemoglobin: 15 g/dL (ref 12.0–16.0)
MCH: 31.7 pg (ref 26.0–34.0)
MCHC: 34.3 g/dL (ref 32.0–36.0)
MCV: 92.4 fL (ref 80.0–100.0)
Platelets: 360 10*3/uL (ref 150–440)
RBC: 4.74 MIL/uL (ref 3.80–5.20)
RDW: 12.7 % (ref 11.5–14.5)
WBC: 7.4 10*3/uL (ref 3.6–11.0)

## 2017-04-05 LAB — LIPASE, BLOOD: Lipase: 67 U/L — ABNORMAL HIGH (ref 11–51)

## 2017-04-05 MED ORDER — SODIUM CHLORIDE 0.9 % IV BOLUS (SEPSIS)
1000.0000 mL | Freq: Once | INTRAVENOUS | Status: AC
  Administered 2017-04-06: 1000 mL via INTRAVENOUS

## 2017-04-05 MED ORDER — SODIUM CHLORIDE 0.9 % IV BOLUS (SEPSIS)
1000.0000 mL | Freq: Once | INTRAVENOUS | Status: AC
  Administered 2017-04-05: 1000 mL via INTRAVENOUS

## 2017-04-05 MED ORDER — IOPAMIDOL (ISOVUE-300) INJECTION 61%
100.0000 mL | Freq: Once | INTRAVENOUS | Status: AC | PRN
  Administered 2017-04-05: 100 mL via INTRAVENOUS

## 2017-04-05 MED ORDER — POTASSIUM CHLORIDE 20 MEQ PO PACK
40.0000 meq | PACK | Freq: Once | ORAL | Status: AC
  Administered 2017-04-06: 40 meq via ORAL
  Filled 2017-04-05: qty 2

## 2017-04-05 NOTE — ED Provider Notes (Signed)
[facility] Emergency Department Provider Note   First MD Initiated Contact with Patient 04/05/17 2307     (approximate)  I have reviewed the triage vital signs and the nursing notes.   HISTORY  Chief Complaint Abdominal Pain   HPI Diane Smith is a 47 y.o. female presents with generalized abdominal discomfort diarrhea times 3 days.  Patient denies any vomiting.  Patient admits to normal bowel movements today however patient admits to continued generalized abdominal cramping with a current pain score 7 out of 10.  Patient denies any fever.  Patient states multiple family members with similar symptoms.   Past Medical History:  Diagnosis Date  . Migraine   . Shingles     There are no active problems to display for this patient.   Past Surgical History:  Procedure Laterality Date  . ANKLE SURGERY Left    with screw  . CHOLECYSTECTOMY    . HEMORRHOID SURGERY    . HEMORRHOID SURGERY N/A 05/30/2014   Procedure: HEMORRHOIDECTOMY;  Surgeon: Renda RollsWilton Smith, MD;  Location: [facility] ORS;  Service: General;  Laterality: N/A;  . LASIK    . NASAL SINUS SURGERY    . NOVASURE ABLATION      Prior to Admission medications   Medication Sig Start Date End Date Taking? Authorizing Provider  albuterol (PROAIR HFA) 108 (90 Base) MCG/ACT inhaler Inhale 2 puffs into the lungs every 4 (four) hours as needed.    [provider]  beclomethasone (QVAR) 80 MCG/ACT inhaler Inhale 2 puffs into the lungs daily.    [provider]  diphenhydrAMINE (BENADRYL) 25 mg capsule Take 1 capsule (25 mg total) by mouth every 6 (six) hours as needed. 12/19/14   Sharman CheekStafford, Phillip, MD  Fexofenadine-Pseudoephedrine (ALLEGRA-D 24 HOUR PO) Take 1 tablet by mouth daily as needed. For allergies    [provider]  Ibuprofen-Famotidine (DUEXIS) 800-26.6 MG TABS Take 1 tablet by mouth 3 (three) times daily. 09/28/16   Felecia ShellingEvans, Brent M, DPM  metoCLOPramide (REGLAN) 10 MG  tablet Take 1 tablet (10 mg total) by mouth 4 (four) times daily -  before meals and at bedtime. Patient not taking: Reported on 06/16/2015 12/19/14   Sharman CheekStafford, Phillip, MD    Allergies Imodium a-d [loperamide hcl]  Family History  Problem Relation Age of Onset  . Breast cancer Maternal Aunt 55  . Breast cancer Maternal Aunt   . Breast cancer Maternal Aunt     Social History Social History   Tobacco Use  . Smoking status: Never Smoker  Substance Use Topics  . Alcohol use: Yes    Alcohol/week: 0.0 oz  . Drug use: No    Review of Systems Constitutional: No fever/chills Eyes: No visual changes. ENT: No sore throat. Cardiovascular: Denies chest pain. Respiratory: Denies shortness of breath. Gastrointestinal: Positive for abdominal pain, and diarrhea. Genitourinary: Negative for dysuria. Musculoskeletal: Negative for neck pain.  Negative for back pain. Integumentary: Negative for rash. Neurological: Negative for headaches, focal weakness or numbness.   ____________________________________________   PHYSICAL EXAM:  VITAL SIGNS: ED Triage Vitals [04/05/17 2003]  Enc Vitals Group     BP 133/87     Pulse Rate 97     Resp 18     Temp 98.1 F (36.7 C)     Temp Source Oral     SpO2 99 %     Weight 66.2 kg (146 lb)     Height 1.575 m (5\' 2" )     Head  Circumference      Peak Flow      Pain Score 7     Pain Loc      Pain Edu?      Excl. in GC?     Constitutional: Alert and oriented. Well appearing and in no acute distress. Eyes: Conjunctivae are normal.  Head: Atraumatic. Mouth/Throat: Mucous membranes are dry. Oropharynx non-erythematous. Neck: No stridor.   Cardiovascular: Normal rate, regular rhythm. Good peripheral circulation. Grossly normal heart sounds. Respiratory: Normal respiratory effort.  No retractions. Lungs CTAB. Gastrointestinal: Soft and nontender. No distention.  Musculoskeletal: No lower extremity tenderness nor edema. No gross deformities of  extremities. Neurologic:  Normal speech and language. No gross focal neurologic deficits are appreciated.  Skin:  Skin is warm, dry and intact. No rash noted.   ____________________________________________   LABS (all labs ordered are listed, but only abnormal results are displayed)  Labs Reviewed  COMPREHENSIVE METABOLIC PANEL - Abnormal; Notable for the following components:      Result Value   Potassium 2.7 (*)    Chloride 99 (*)    Calcium 8.4 (*)    All other components within normal limits  LIPASE, BLOOD - Abnormal; Notable for the following components:   Lipase 67 (*)    All other components within normal limits  URINALYSIS, COMPLETE (UACMP) WITH MICROSCOPIC - Abnormal; Notable for the following components:   Color, Urine YELLOW (*)    APPearance HAZY (*)    Hgb urine dipstick LARGE (*)    Ketones, ur 20 (*)    Leukocytes, UA TRACE (*)    Squamous Epithelial / LPF 0-5 (*)    All other components within normal limits  CBC  PREGNANCY, URINE   ________________  RADIOLOGY I, [place] Namiko Pritts, personally viewed and evaluated these images (plain radiographs) as part of my medical decision making, as well as reviewing the written report by the radiologist.   ED MD interpretation: Unremarkable single view abdomen.  Official radiology report(s): Dg Abdomen 1 View  Result Date: 04/05/2017 CLINICAL DATA:  47 year old female with abdominal pain. Possible constipation. EXAM: ABDOMEN - 1 VIEW COMPARISON:  None. FINDINGS: There is no bowel dilatation or evidence of obstruction. Air is noted throughout the colon. No free air or radiopaque calculi. Right upper quadrant cholecystectomy clips. The osseous structures and soft tissues appear unremarkable. IMPRESSION: Negative. Electronically Signed   By: Elgie Collard M.D.   On: 04/05/2017 20:53      Procedures   ____________________________________________   INITIAL IMPRESSION / ASSESSMENT AND PLAN / ED COURSE  As part of  my medical decision making, I reviewed the following data within the electronic MEDICAL RECORD NUMBER  47 year old female present with above-stated history and physical exam secondary to abdominal discomfort and diarrhea.  Suspect infectious etiology given multiple family members with the same.  However given abdominal discomfort with palpation CT scan of the abdomen performed which was unremarkable with the exception of nonspecific findings of the cervix.  Spoke with the patient at length who states that she has a yearly Pap smear performed with no noted abnormalities.  Patient does admit to currently demonstrating.  I advised the patient to follow-up with her OB/GYN for further outpatient evaluation.  Patient abdominal discomfort has improved no vomiting or diarrhea.    ____________________________________________  FINAL CLINICAL IMPRESSION(S) / ED DIAGNOSES  Final diagnoses:  Gastroenteritis     MEDICATIONS GIVEN DURING THIS VISIT:  Medications  sodium chloride 0.9 % bolus 1,000 mL (not administered)  sodium chloride 0.9 % bolus 1,000 mL (not administered)  potassium chloride (KLOR-CON) packet 40 mEq (not administered)     ED Discharge Orders    None       Note:  This document was prepared using Dragon voice recognition software and may include unintentional dictation errors.    Darci Current, MD 04/06/17 [phone]

## 2017-04-05 NOTE — ED Triage Notes (Signed)
Pt in with co abd pain pt is passing gas, had BM Saturday. Has had vomiting and diarrhea since Saturday, no vomiting or diarrhea since Sunday. Pt went to urgent care and was told to come here to rule out obstruction, pt has hx of constipation.

## 2017-04-05 NOTE — ED Notes (Signed)
ED Provider at bedside. 

## 2017-04-05 NOTE — ED Notes (Signed)
Family at bedside. 

## 2017-04-06 MED ORDER — POTASSIUM CHLORIDE CRYS ER 20 MEQ PO TBCR
EXTENDED_RELEASE_TABLET | ORAL | Status: AC
  Filled 2017-04-06: qty 2

## 2017-04-06 MED ORDER — POTASSIUM CHLORIDE CRYS ER 20 MEQ PO TBCR
40.0000 meq | EXTENDED_RELEASE_TABLET | Freq: Once | ORAL | Status: AC
  Administered 2017-04-06: 40 meq via ORAL

## 2017-04-06 NOTE — ED Notes (Signed)
ED Provider at bedside. 

## 2017-04-06 NOTE — ED Notes (Signed)

## 2017-04-24 IMAGING — MG MM DIGITAL SCREENING BILAT W/ CAD
5 series · 5 of 5 positions shown · non-contrast
Comparison: Previous exam(s).

CLINICAL DATA: Screening.

EXAM:
DIGITAL SCREENING BILATERAL MAMMOGRAM WITH CAD

[L MLO]
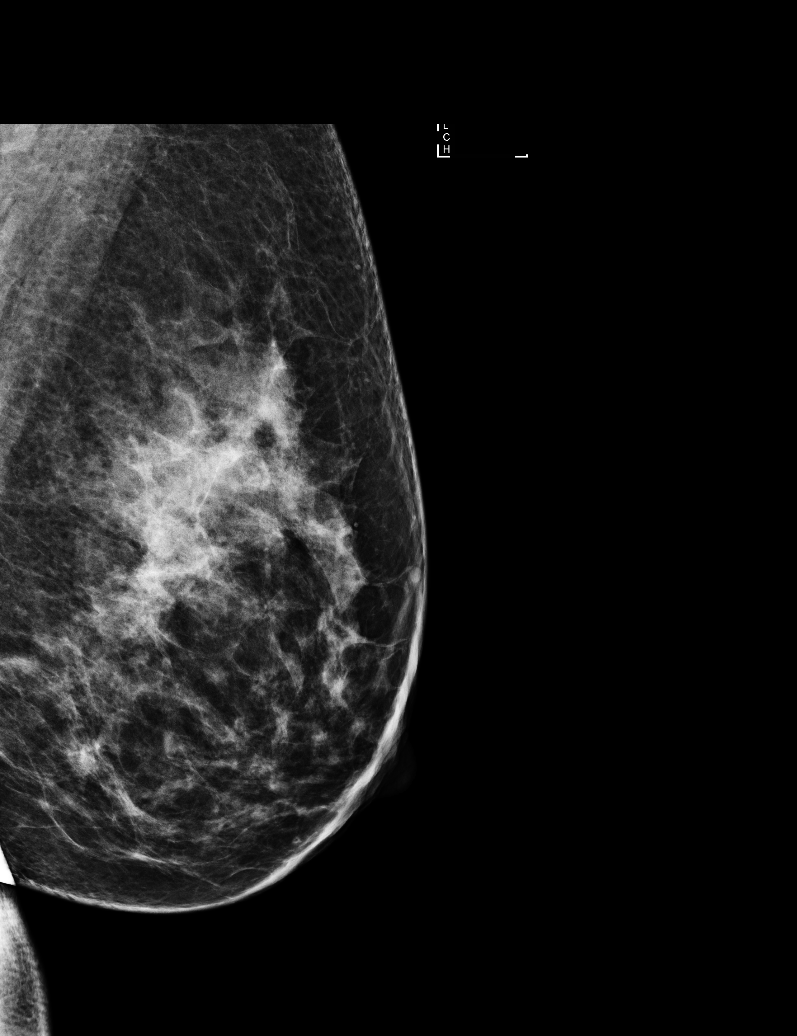

[L CC]
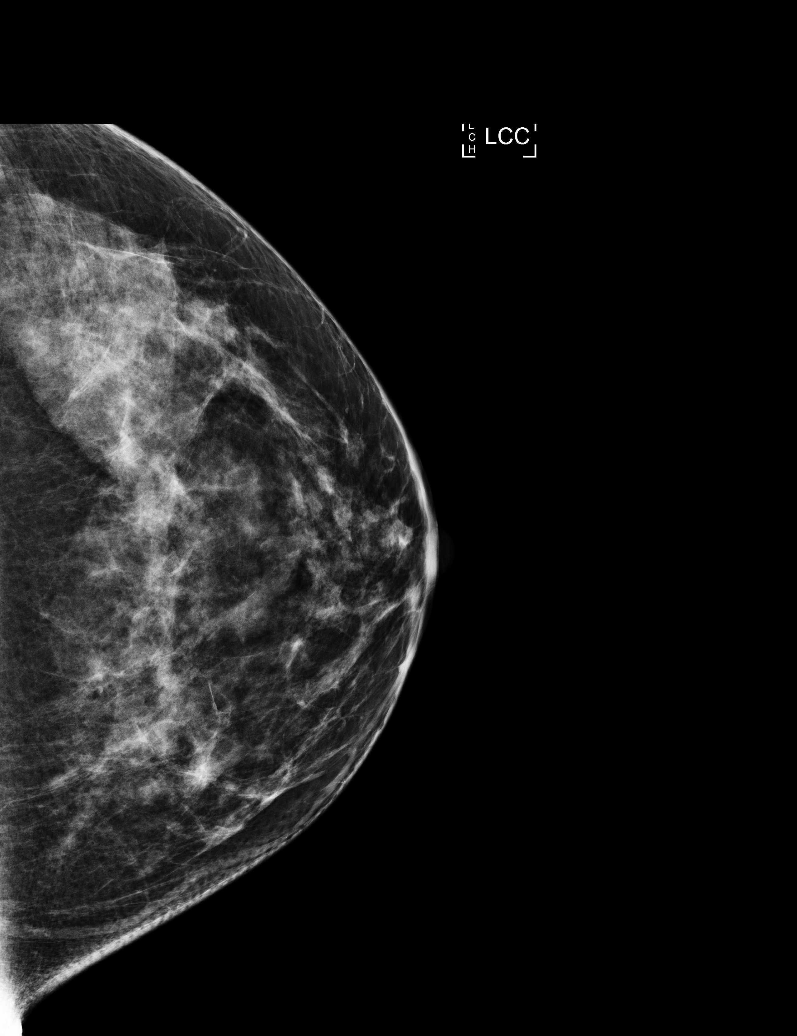

[R MLO (1 of 2)]
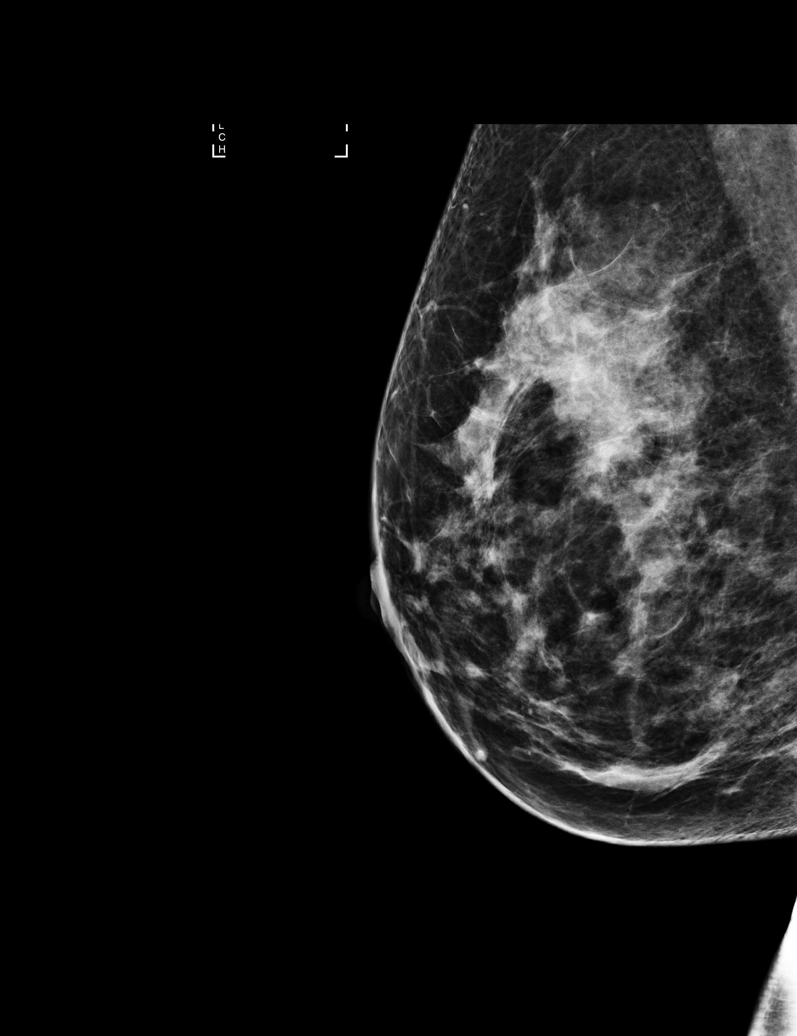

[R MLO (2 of 2)]
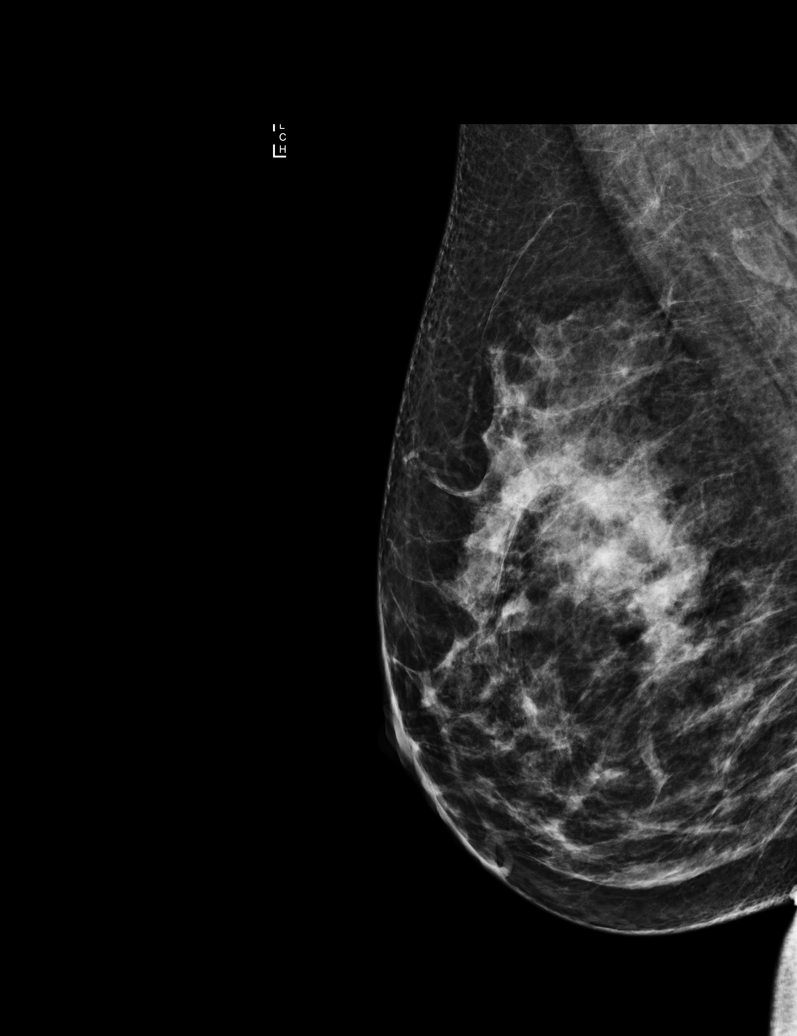

[R CC]
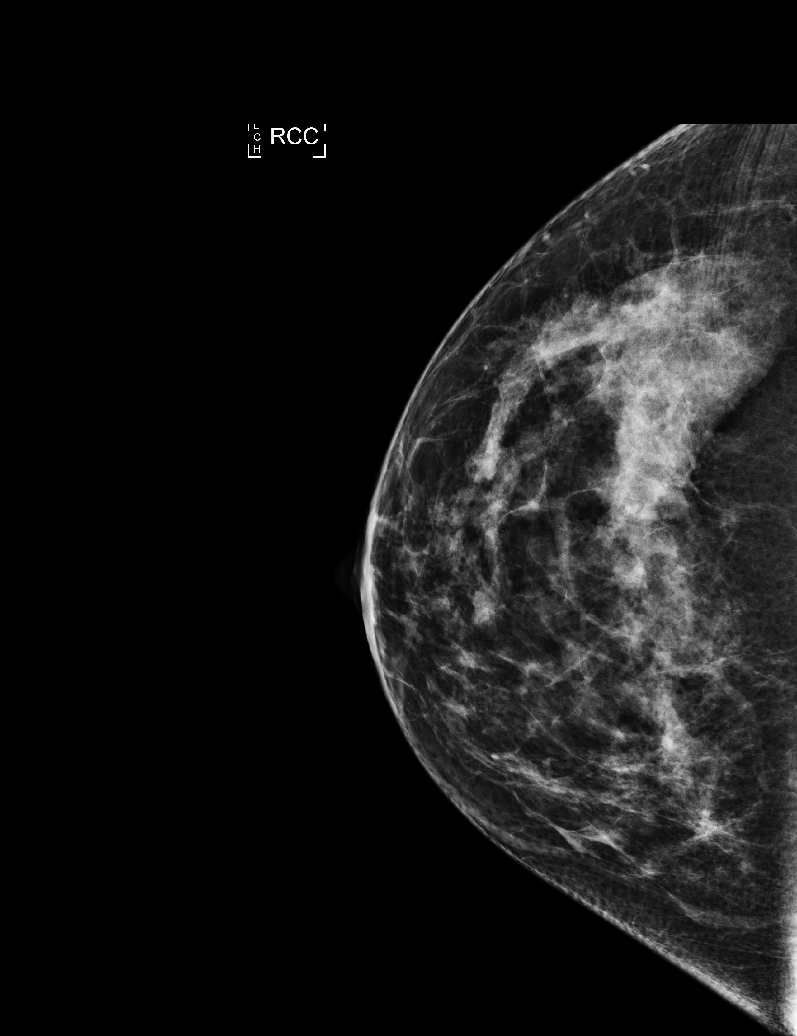

[5 of 5 positions shown; findings below may reference images not displayed]

ACR Breast Density Category c: The breast tissue is heterogeneously
dense, which may obscure small masses.
FINDINGS: There are no findings suspicious for malignancy. Images were
processed with CAD.
IMPRESSION: No mammographic evidence of malignancy. A result letter of this
screening mammogram will be mailed directly to the patient.

RECOMMENDATION:
Screening mammogram in one year. (Code:YJ-[id number])

BI-RADS CATEGORY  1: Negative.

## 2017-06-06 ENCOUNTER — Other Ambulatory Visit: Payer: Self-pay | Admitting: Obstetrics and Gynecology

## 2017-06-06 DIAGNOSIS — Z1231 Encounter for screening mammogram for malignant neoplasm of breast: Secondary | ICD-10-CM

## 2017-06-28 ENCOUNTER — Ambulatory Visit
Admission: RE | Admit: 2017-06-28 | Discharge: 2017-06-28 | Disposition: A | Discharge status: Home / Self Care | Payer: 59 | Source: Ambulatory Visit | Attending: Obstetrics and Gynecology | Admitting: Obstetrics and Gynecology

## 2017-06-28 DIAGNOSIS — Z1231 Encounter for screening mammogram for malignant neoplasm of breast: Secondary | ICD-10-CM | POA: Diagnosis not present

## 2018-06-23 ENCOUNTER — Other Ambulatory Visit: Payer: Self-pay | Admitting: Obstetrics and Gynecology

## 2018-06-23 DIAGNOSIS — Z1231 Encounter for screening mammogram for malignant neoplasm of breast: Secondary | ICD-10-CM

## 2018-08-04 ENCOUNTER — Ambulatory Visit
Admission: RE | Admit: 2018-08-04 | Discharge: 2018-08-04 | Disposition: A | Discharge status: Home / Self Care | Payer: 59 | Source: Ambulatory Visit | Attending: Obstetrics and Gynecology | Admitting: Obstetrics and Gynecology

## 2018-08-04 ENCOUNTER — Other Ambulatory Visit: Payer: Self-pay

## 2018-08-04 DIAGNOSIS — Z1231 Encounter for screening mammogram for malignant neoplasm of breast: Secondary | ICD-10-CM | POA: Diagnosis not present

## 2018-08-09 ENCOUNTER — Other Ambulatory Visit: Payer: Self-pay | Admitting: Obstetrics and Gynecology

## 2018-08-09 DIAGNOSIS — N6310 Unspecified lump in the right breast, unspecified quadrant: Secondary | ICD-10-CM

## 2018-08-09 DIAGNOSIS — R928 Other abnormal and inconclusive findings on diagnostic imaging of breast: Secondary | ICD-10-CM

## 2018-08-16 ENCOUNTER — Ambulatory Visit
Admission: RE | Admit: 2018-08-16 | Discharge: 2018-08-16 | Disposition: A | Discharge status: Home / Self Care | Payer: 59 | Source: Ambulatory Visit | Attending: Obstetrics and Gynecology | Admitting: Obstetrics and Gynecology

## 2018-08-16 DIAGNOSIS — N6310 Unspecified lump in the right breast, unspecified quadrant: Secondary | ICD-10-CM | POA: Diagnosis present

## 2018-08-16 DIAGNOSIS — R928 Other abnormal and inconclusive findings on diagnostic imaging of breast: Secondary | ICD-10-CM | POA: Diagnosis not present

## 2019-05-02 ENCOUNTER — Other Ambulatory Visit: Payer: Self-pay | Admitting: Unknown Physician Specialty

## 2019-05-02 DIAGNOSIS — R221 Localized swelling, mass and lump, neck: Secondary | ICD-10-CM

## 2019-05-11 ENCOUNTER — Ambulatory Visit: Payer: 59

## 2019-05-16 ENCOUNTER — Other Ambulatory Visit: Payer: Self-pay

## 2019-05-16 ENCOUNTER — Ambulatory Visit
Admission: RE | Admit: 2019-05-16 | Discharge: 2019-05-16 | Disposition: A | Discharge status: Home / Self Care | Payer: 59 | Source: Ambulatory Visit | Attending: Unknown Physician Specialty | Admitting: Unknown Physician Specialty

## 2019-05-16 DIAGNOSIS — R221 Localized swelling, mass and lump, neck: Secondary | ICD-10-CM | POA: Insufficient documentation

## 2019-05-16 HISTORY — DX: Unspecified asthma, uncomplicated: J45.909

## 2019-05-16 MED ORDER — IOHEXOL 300 MG/ML  SOLN
75.0000 mL | Freq: Once | INTRAMUSCULAR | Status: AC | PRN
  Administered 2019-05-16: 75 mL via INTRAVENOUS

## 2020-01-10 ENCOUNTER — Other Ambulatory Visit: Payer: Self-pay | Admitting: Internal Medicine

## 2020-01-10 DIAGNOSIS — R1032 Left lower quadrant pain: Secondary | ICD-10-CM

## 2020-01-17 ENCOUNTER — Other Ambulatory Visit: Payer: Self-pay

## 2020-01-17 ENCOUNTER — Ambulatory Visit
Admission: RE | Admit: 2020-01-17 | Discharge: 2020-01-17 | Disposition: A | Discharge status: Home / Self Care | Payer: 59 | Source: Ambulatory Visit | Attending: Internal Medicine | Admitting: Internal Medicine

## 2020-01-17 DIAGNOSIS — R1032 Left lower quadrant pain: Secondary | ICD-10-CM | POA: Insufficient documentation

## 2020-01-17 DIAGNOSIS — R1031 Right lower quadrant pain: Secondary | ICD-10-CM | POA: Insufficient documentation

## 2020-01-17 MED ORDER — IOHEXOL 300 MG/ML  SOLN
100.0000 mL | Freq: Once | INTRAMUSCULAR | Status: AC | PRN
  Administered 2020-01-17: 100 mL via INTRAVENOUS

## 2020-01-21 ENCOUNTER — Encounter: Payer: Self-pay | Admitting: Urology

## 2020-01-21 ENCOUNTER — Other Ambulatory Visit: Payer: Self-pay

## 2020-01-21 ENCOUNTER — Ambulatory Visit (INDEPENDENT_AMBULATORY_CARE_PROVIDER_SITE_OTHER): Payer: 59 | Admitting: Urology

## 2020-01-21 VITALS — BP 115/78 | HR 85

## 2020-01-21 DIAGNOSIS — N390 Urinary tract infection, site not specified: Secondary | ICD-10-CM | POA: Diagnosis not present

## 2020-01-21 DIAGNOSIS — R350 Frequency of micturition: Secondary | ICD-10-CM | POA: Diagnosis not present

## 2020-01-21 LAB — BLADDER SCAN AMB NON-IMAGING: Scan Result: 16

## 2020-01-21 NOTE — Progress Notes (Signed)
01/21/2020 10:18 AM   Diane Smith 1970/05/28 [id number]  Referring provider: Enid Baas, MD [address] [place],  [place] [address]  Chief Complaint  Patient presents with  . Recurrent UTI    HPI: I was consulted to assist the patient's to recent bladder infections and vague suprapubic discomfort.  She also has significant flow symptoms.  She can has some lower abdominal discomfort like a band across the lower abdomen.  Is not relieved by voiding.  Is not daily.  She had a CT scan a week ago with contrast that was normal  She has a lot of stopping and starting the stream with positional changes.  It takes more time to void.  She finally does feel empty.  This may have started since a few recent bladder infections  In July and November she had foul-smelling urine.  She had little bit of discomfort.  I think she had increased nocturia.  The foul-smelling improved with treatment of the infections.  Currently she voids every 60 to 90 minutes.  She gets up once a night.  No smoking history.  She had a bladder suspension years ago.  No neurologic issues.  No hysterectomy.  No kidney stones   PMH: Past Medical History:  Diagnosis Date  . Asthma   . Migraine   . Shingles     Surgical History: Past Surgical History:  Procedure Laterality Date  . ANKLE SURGERY Left    with screw  . CHOLECYSTECTOMY    . HEMORRHOID SURGERY    . HEMORRHOID SURGERY N/A 05/30/2014   Procedure: HEMORRHOIDECTOMY;  Surgeon: Renda Rolls, MD;  Location: [facility] ORS;  Service: General;  Laterality: N/A;  . LASIK    . NASAL SINUS SURGERY    . NOVASURE ABLATION      Home Medications:  Allergies as of 01/21/2020      Reactions   Imodium A-d [loperamide Hcl] Swelling   Facial and throat swelling      Medication List       Accurate as of January 21, 2020 10:18 AM. If you have any questions, ask your nurse or doctor.        ALLEGRA-D 24 HOUR PO Take 1 tablet by mouth daily as  needed. For allergies   beclomethasone 80 MCG/ACT inhaler Commonly known as: QVAR Inhale 2 puffs into the lungs daily.   diphenhydrAMINE 25 mg capsule Commonly known as: BENADRYL Take 1 capsule (25 mg total) by mouth every 6 (six) hours as needed.   Ibuprofen-Famotidine 800-26.6 MG Tabs Commonly known as: Duexis Take 1 tablet by mouth 3 (three) times daily.   metoCLOPramide 10 MG tablet Commonly known as: REGLAN Take 1 tablet (10 mg total) by mouth 4 (four) times daily -  before meals and at bedtime.   ProAir HFA 108 (90 Base) MCG/ACT inhaler Generic drug: albuterol Inhale 2 puffs into the lungs every 4 (four) hours as needed.       Allergies:  Allergies  Allergen Reactions  . Imodium A-D [Loperamide Hcl] Swelling    Facial and throat swelling    Family History: Family History  Problem Relation Age of Onset  . Breast cancer Maternal Aunt 55  . Breast cancer Maternal Aunt   . Breast cancer Maternal Aunt     Social History:  reports that she has never smoked. She has never used smokeless tobacco. She reports current alcohol use. She reports that she does not use drugs.  ROS:  Physical Exam: LMP 01/13/2020   Constitutional:  Alert and oriented, No acute distress. HEENT: [place] AT, moist mucus membranes.  Trachea midline, no masses. Cardiovascular: No clubbing, cyanosis, or edema. Respiratory: Normal respiratory effort, no increased work of breathing. GI: Abdomen is soft, nontender, nondistended, no abdominal masses GU: On pelvic examination the patient has significant uterine descensus or cervical descensus.  She likely has a long cervix and a short anterior vaginal wall.  She had a mild cystocele with very little central defect.  Her tissues were quite elastic and hypermobile.  No stress incontinence.  No rectocele Skin: No rashes, bruises or suspicious lesions. Lymph: No cervical or inguinal  adenopathy. Neurologic: Grossly intact, no focal deficits, moving all 4 extremities. Psychiatric: Normal mood and affect.  Laboratory Data: Lab Results  Component Value Date   WBC 7.4 04/05/2017   HGB 15.0 04/05/2017   HCT 43.8 04/05/2017   MCV 92.4 04/05/2017   PLT 360 04/05/2017    Lab Results  Component Value Date   CREATININE 0.56 04/05/2017    No results found for: PSA  No results found for: TESTOSTERONE  No results found for: HGBA1C  Urinalysis    Component Value Date/Time   COLORURINE YELLOW (A) 04/05/2017 2006   APPEARANCEUR HAZY (A) 04/05/2017 2006   LABSPEC 1.010 04/05/2017 2006   PHURINE 6.0 04/05/2017 2006   GLUCOSEU NEGATIVE 04/05/2017 2006   HGBUR LARGE (A) 04/05/2017 2006   BILIRUBINUR NEGATIVE 04/05/2017 2006   KETONESUR 20 (A) 04/05/2017 2006   PROTEINUR NEGATIVE 04/05/2017 2006   NITRITE NEGATIVE 04/05/2017 2006   LEUKOCYTESUR TRACE (A) 04/05/2017 2006    Pertinent Imaging: Chart reviewed.  Urine reviewed.  Urine sent for culture  Assessment & Plan: Patient has had 2 bladder infections possibly.  She has flow symptoms noted.  She will return for safety cystoscopy.  I will not start prophylaxis yet unless there is more culture prove.  I will offer Flomax and/or physical therapy or both.  I do not think she has interstitial cystitis at this stage.  A picture was drawn.  Conceptually her cervix could be obstructing her urine stream or altering the anatomy causing the symptoms.  She is asymptomatic and I think would be difficult to justify recommending a hysterectomy and/or vault suspension and/or cystocele repair.  I would not be surprised that she just would need a hysterectomy and not the prolapse repair.  She would need urodynamics prior.  Unfortunately if unrelated and she had the surgery extensive surgery would not help her symptoms.  Patient has good understanding of  There are no diagnoses linked to this encounter.  No follow-ups on  file.  Martina Sinner, MD  [facility] [address] [place], [place] [address] [phone]

## 2020-01-21 NOTE — Patient Instructions (Signed)
Cystoscopy Cystoscopy is a procedure that is used to help diagnose and sometimes treat conditions that affect the lower urinary tract. The lower urinary tract includes the bladder and the urethra. The urethra is the tube that drains urine from the bladder. Cystoscopy is done using a thin, tube-shaped instrument with a light and camera at the end (cystoscope). The cystoscope may be hard or flexible, depending on the goal of the procedure. The cystoscope is inserted through the urethra, into the bladder. Cystoscopy may be recommended if you have:  Urinary tract infections that keep coming back.  Blood in the urine (hematuria).  An inability to control when you urinate (urinary incontinence) or an overactive bladder.  Unusual cells found in a urine sample.  A blockage in the urethra, such as a urinary stone.  Painful urination.  An abnormality in the bladder found during an intravenous pyelogram (IVP) or CT scan. Cystoscopy may also be done to remove a sample of tissue to be examined under a microscope (biopsy). What are the risks? Generally, this is a safe procedure. However, problems may occur, including:  Infection.  Bleeding.  What happens during the procedure?  1. You will be given one or more of the following: ? A medicine to numb the area (local anesthetic). 2. The area around the opening of your urethra will be cleaned. 3. The cystoscope will be passed through your urethra into your bladder. 4. Germ-free (sterile) fluid will flow through the cystoscope to fill your bladder. The fluid will stretch your bladder so that your health care provider can clearly examine your bladder walls. 5. Your doctor will look at the urethra and bladder. 6. The cystoscope will be removed The procedure may vary among health care providers  What can I expect after the procedure? After the procedure, it is common to have: 1. Some soreness or pain in your abdomen and urethra. 2. Urinary symptoms.  These include: ? Mild pain or burning when you urinate. Pain should stop within a few minutes after you urinate. This may last for up to 1 week. ? A small amount of blood in your urine for several days. ? Feeling like you need to urinate but producing only a small amount of urine. Follow these instructions at home: General instructions  Return to your normal activities as told by your health care provider.   Do not drive for 24 hours if you were given a sedative during your procedure.  Watch for any blood in your urine. If the amount of blood in your urine increases, call your health care provider.  If a tissue sample was removed for testing (biopsy) during your procedure, it is up to you to get your test results. Ask your health care provider, or the department that is doing the test, when your results will be ready.  Drink enough fluid to keep your urine pale yellow.  Keep all follow-up visits as told by your health care provider. This is important. Contact a health care provider if you:  Have pain that gets worse or does not get better with medicine, especially pain when you urinate.  Have trouble urinating.  Have more blood in your urine. Get help right away if you:  Have blood clots in your urine.  Have abdominal pain.  Have a fever or chills.  Are unable to urinate. Summary  Cystoscopy is a procedure that is used to help diagnose and sometimes treat conditions that affect the lower urinary tract.  Cystoscopy is done using   a thin, tube-shaped instrument with a light and camera at the end.  After the procedure, it is common to have some soreness or pain in your abdomen and urethra.  Watch for any blood in your urine. If the amount of blood in your urine increases, call your health care provider.  If you were prescribed an antibiotic medicine, take it as told by your health care provider. Do not stop taking the antibiotic even if you start to feel better. This  information is not intended to replace advice given to you by your health care provider. Make sure you discuss any questions you have with your health care provider. Document Revised: 01/03/2018 Document Reviewed: 01/03/2018 Elsevier Patient Education  2020 [employer]   

## 2020-01-22 LAB — URINALYSIS, COMPLETE
Bilirubin, UA: NEGATIVE
Glucose, UA: NEGATIVE
Ketones, UA: NEGATIVE
Leukocytes,UA: NEGATIVE
Nitrite, UA: NEGATIVE
Protein,UA: NEGATIVE
RBC, UA: NEGATIVE
Specific Gravity, UA: >1.03 — ABNORMAL HIGH (ref 1.005–1.030)
Urobilinogen, Ur: 0.2 mg/dL (ref 0.2–1.0)
pH, UA: 6 (ref 5.0–7.5)

## 2020-01-22 LAB — MICROSCOPIC EXAMINATION

## 2020-01-26 LAB — CULTURE, URINE COMPREHENSIVE

## 2020-02-11 ENCOUNTER — Other Ambulatory Visit: Payer: Self-pay | Admitting: Urology

## 2020-02-18 ENCOUNTER — Ambulatory Visit (INDEPENDENT_AMBULATORY_CARE_PROVIDER_SITE_OTHER): Payer: 59 | Admitting: Urology

## 2020-02-18 ENCOUNTER — Encounter: Payer: Self-pay | Admitting: Urology

## 2020-02-18 ENCOUNTER — Other Ambulatory Visit: Payer: Self-pay

## 2020-02-18 VITALS — BP 158/95 | HR 85

## 2020-02-18 DIAGNOSIS — N390 Urinary tract infection, site not specified: Secondary | ICD-10-CM

## 2020-02-18 DIAGNOSIS — N3020 Other chronic cystitis without hematuria: Secondary | ICD-10-CM

## 2020-02-18 LAB — URINALYSIS, COMPLETE
Bilirubin, UA: NEGATIVE
Glucose, UA: NEGATIVE
Ketones, UA: NEGATIVE
Leukocytes,UA: NEGATIVE
Nitrite, UA: NEGATIVE
Protein,UA: NEGATIVE
Specific Gravity, UA: >1.03 — ABNORMAL HIGH (ref 1.005–1.030)
Urobilinogen, Ur: 0.2 mg/dL (ref 0.2–1.0)
pH, UA: 5.5 (ref 5.0–7.5)

## 2020-02-18 LAB — MICROSCOPIC EXAMINATION: Epithelial Cells (non renal): >10 /hpf — AB (ref 0–10)

## 2020-02-18 MED ORDER — NITROFURANTOIN MACROCRYSTAL 100 MG PO CAPS: 100.0000 mg | ORAL_CAPSULE | Freq: Four times a day (QID) | ORAL | 11 refills | Status: DC

## 2020-02-18 NOTE — Progress Notes (Signed)
02/18/2020 8:34 AM   Erie Noe 02-Aug-1970 [id number]  Referring provider: Enid Baas, MD [address] [place],  [place] [address]  Chief Complaint  Patient presents with  . Cysto    HPI: I was consulted to assist the patient's two recent bladder infections and vague suprapubic discomfort.  She also has significant flow symptoms.  She can has some lower abdominal discomfort like a band across the lower abdomen.  Is not relieved by voiding.  Is not daily.  She had a CT scan a week ago with contrast that was normal  She has a lot of stopping and starting the stream with positional changes.  It takes more time to void.  She finally does feel empty.  This may have started since a few recent bladder infections  In July and November she had foul-smelling urine.  She had little bit of discomfort.  I think she had increased nocturia.  The foul-smelling improved with treatment of the infections.  Currently she voids every 60 to 90 minutes.  She gets up once a night.  No smoking history.  She had a bladder suspension years ago.  Patient has had 2 bladder infections possibly.  She has flow symptoms noted.  She will return for safety cystoscopy.  I will not start prophylaxis yet unless there is more culture prove.  I will offer Flomax and/or physical therapy or both.  I do not think she has interstitial cystitis at this stage.  On pelvic examination the patient has significant uterine descensus or cervical descensus.  She likely has a long cervix and a short anterior vaginal wall.  She had a mild cystocele with very little central defect.  Her tissues were quite elastic and hypermobile.  No stress incontinence.  No rectocele  A picture was drawn.  Conceptually her cervix could be obstructing her urine stream or altering the anatomy causing the symptoms.  She is asymptomatic and I think would be difficult to justify recommending a hysterectomy and/or vault suspension and/or  cystocele repair.  I would not be surprised that she just would need a hysterectomy and not the prolapse repair.  She would need urodynamics prior.  Unfortunately if unrelated and she had extensive surgery would not help her symptoms.  Patient has good understanding  Today [place] stable.  No bladder infection symptoms.  Last urine culture negative.  No smoking history Cystoscopy: Patient underwent flexible cystoscopy.  There was a lot of white flecks in the urine.  Urine was aspirated for culture.  Along the right lateral wall there was 1 small erythematous area approximately 1.5 cm in length.  It almost looks like she had some petechiae or glomerulations in that area.  She was not experiencing pain.      PMH: Past Medical History:  Diagnosis Date  . Asthma   . Migraine   . Shingles     Surgical History: Past Surgical History:  Procedure Laterality Date  . ANKLE SURGERY Left    with screw  . CHOLECYSTECTOMY    . HEMORRHOID SURGERY    . HEMORRHOID SURGERY N/A 05/30/2014   Procedure: HEMORRHOIDECTOMY;  Surgeon: Renda Rolls, MD;  Location: [facility] ORS;  Service: General;  Laterality: N/A;  . LASIK    . NASAL SINUS SURGERY    . NOVASURE ABLATION      Home Medications:  Allergies as of 02/18/2020      Reactions   Imodium A-d [loperamide Hcl] Swelling   Facial and throat swelling  Medication List       Accurate as of February 18, 2020  8:34 AM. If you have any questions, ask your nurse or doctor.        Symbicort 160-4.5 MCG/ACT inhaler Generic drug: budesonide-formoterol Inhale into the lungs.       Allergies:  Allergies  Allergen Reactions  . Imodium A-D [Loperamide Hcl] Swelling    Facial and throat swelling    Family History: Family History  Problem Relation Age of Onset  . Breast cancer Maternal Aunt 55  . Breast cancer Maternal Aunt   . Breast cancer Maternal Aunt     Social History:  reports that she has never smoked. She has never used smokeless  tobacco. She reports current alcohol use. She reports that she does not use drugs.  ROS:                                        Physical Exam: There were no vitals taken for this visit.  Constitutional:  Alert and oriented, No acute distress.  Laboratory Data: Lab Results  Component Value Date   WBC 7.4 04/05/2017   HGB 15.0 04/05/2017   HCT 43.8 04/05/2017   MCV 92.4 04/05/2017   PLT 360 04/05/2017    Lab Results  Component Value Date   CREATININE 0.56 04/05/2017    No results found for: PSA  No results found for: TESTOSTERONE  No results found for: HGBA1C  Urinalysis    Component Value Date/Time   COLORURINE YELLOW (A) 04/05/2017 2006   APPEARANCEUR Cloudy (A) 01/21/2020 1019   LABSPEC 1.010 04/05/2017 2006   PHURINE 6.0 04/05/2017 2006   GLUCOSEU Negative 01/21/2020 1019   HGBUR LARGE (A) 04/05/2017 2006   BILIRUBINUR Negative 01/21/2020 1019   KETONESUR 20 (A) 04/05/2017 2006   PROTEINUR Negative 01/21/2020 1019   PROTEINUR NEGATIVE 04/05/2017 2006   NITRITE Negative 01/21/2020 1019   NITRITE NEGATIVE 04/05/2017 2006   LEUKOCYTESUR Negative 01/21/2020 1019    Pertinent Imaging:   Assessment & Plan: When I asked the patient the feeling that she needs to go all the time associated with some flow symptoms was her main complaint.   The urine certainly look cloudy today with white flecks.  I sent the aspirate urine for culture.  I would like to perform cystoscopy in approximately 10 weeks.  I do not think she has carcinoma.  She could have interstitial cystitis with the petechiae noted although her bladder had not been over distended.  If the findings continue I discussed with her the role of a hydrodistention in detail today with usual template.   I want to see if her urgency and flow symptoms down regulate or change in rescoped in 10 weeks.  She understands if if there is still redness we would do the hydrodistention as well as a bladder  biopsy.  I did not send a urine cytology today.  I put her on Macrodantin 100 mg 30x11  1. Recurrent UTI  - Urinalysis, Complete   No follow-ups on file.  Martina Sinner, MD  [facility] [address] [place], [place] [address] [phone]

## 2020-02-21 LAB — CULTURE, URINE COMPREHENSIVE

## 2020-03-04 ENCOUNTER — Telehealth: Payer: Self-pay

## 2020-03-04 MED ORDER — NITROFURANTOIN MACROCRYSTAL 100 MG PO CAPS: 100.0000 mg | ORAL_CAPSULE | Freq: Every day | ORAL | 11 refills | Status: DC

## 2020-03-04 NOTE — Telephone Encounter (Signed)
Patient called stating that the daily suppression abx that she was to start at after her last visit is not at her pharmacy. It appears there was an error with escribe that day and the transmission failed. The directions were also incorrect according to Dr. Mina Marble note and per the patient it should be one pill daily. This was corrected on the prescription and the script was sent to the pharmacy.  Escribe was confirmed today by pharmacy

## 2020-04-28 ENCOUNTER — Other Ambulatory Visit: Payer: Self-pay | Admitting: Internal Medicine

## 2020-04-28 ENCOUNTER — Other Ambulatory Visit: Payer: Self-pay | Admitting: Urology

## 2020-04-28 DIAGNOSIS — R221 Localized swelling, mass and lump, neck: Secondary | ICD-10-CM

## 2020-04-30 ENCOUNTER — Other Ambulatory Visit: Payer: Self-pay

## 2020-04-30 ENCOUNTER — Ambulatory Visit
Admission: RE | Admit: 2020-04-30 | Discharge: 2020-04-30 | Disposition: A | Discharge status: Home / Self Care | Payer: 59 | Source: Ambulatory Visit | Attending: Internal Medicine | Admitting: Internal Medicine

## 2020-04-30 DIAGNOSIS — R221 Localized swelling, mass and lump, neck: Secondary | ICD-10-CM | POA: Diagnosis not present

## 2020-05-26 ENCOUNTER — Other Ambulatory Visit: Payer: Self-pay | Admitting: Unknown Physician Specialty

## 2020-05-26 DIAGNOSIS — R221 Localized swelling, mass and lump, neck: Secondary | ICD-10-CM

## 2020-08-11 ENCOUNTER — Ambulatory Visit (INDEPENDENT_AMBULATORY_CARE_PROVIDER_SITE_OTHER): Payer: 59 | Admitting: Urology

## 2020-08-11 ENCOUNTER — Other Ambulatory Visit: Payer: Self-pay

## 2020-08-11 VITALS — BP 133/84 | HR 93

## 2020-08-11 DIAGNOSIS — N390 Urinary tract infection, site not specified: Secondary | ICD-10-CM | POA: Diagnosis not present

## 2020-08-11 DIAGNOSIS — R339 Retention of urine, unspecified: Secondary | ICD-10-CM

## 2020-08-11 LAB — URINALYSIS, COMPLETE
Bilirubin, UA: NEGATIVE
Glucose, UA: NEGATIVE
Ketones, UA: NEGATIVE
Leukocytes,UA: NEGATIVE
Nitrite, UA: NEGATIVE
Protein,UA: NEGATIVE
Specific Gravity, UA: 1.03 (ref 1.005–1.030)
Urobilinogen, Ur: 0.2 mg/dL (ref 0.2–1.0)
pH, UA: 5.5 (ref 5.0–7.5)

## 2020-08-11 LAB — MICROSCOPIC EXAMINATION: WBC, UA: NONE SEEN /hpf (ref 0–5)

## 2020-08-11 MED ORDER — TAMSULOSIN HCL 0.4 MG PO CAPS: 0.4000 mg | ORAL_CAPSULE | Freq: Every day | ORAL | 3 refills | Status: DC

## 2020-08-11 NOTE — Addendum Note (Signed)
Addended by: Veneta Penton on: 08/11/2020 11:32 AM   Modules accepted: Orders

## 2020-08-11 NOTE — Progress Notes (Signed)
08/11/2020 10:40 AM   Diane Smith 01-24-1971 [id number]  Referring provider: Enid Baas, MD [address] [place],  [place] [address]  Chief Complaint  Patient presents with   Cysto    HPI: I was consulted to assist the patient's two recent bladder infections and vague suprapubic discomfort.  She also has significant flow symptoms.  She can has some lower abdominal discomfort like a band across the lower abdomen.  Is not relieved by voiding.  Is not daily.  She had a CT scan a week ago with contrast that was normal   She has a lot of stopping and starting the stream with positional changes.  It takes more time to void.  She finally does feel empty.  This may have started since a few recent bladder infections   In July and November she had foul-smelling urine.  She had little bit of discomfort.  I think she had increased nocturia.  The foul-smelling improved with treatment of the infections.   Currently she voids every 60 to 90 minutes.  She gets up once a night.  No smoking history.  She had a bladder suspension years ago.   Patient has had 2 bladder infections possibly.  She has flow symptoms noted.  I will not start prophylaxis yet unless there is more culture prove.  I will offer Flomax and/or physical therapy or both.  I do not think she has interstitial cystitis at this stage.   On pelvic examination the patient has significant uterine descensus or cervical descensus.  She likely has a long cervix and a short anterior vaginal wall.  She had a mild cystocele with very little central defect.  Her tissues were quite elastic and hypermobile.  No stress incontinence.  No rectocele   A picture was drawn.  Conceptually her cervix could be obstructing her urine stream or altering the anatomy causing the symptoms.  She is asymptomatic and I think would be difficult to justify recommending a hysterectomy and/or vault suspension and/or cystocele repair.  I would not be  surprised that she just would need a hysterectomy and not the prolapse repair.  She would need urodynamics prior.  Unfortunately if unrelated and she had extensive surgery would not help her symptoms.  Patient has good understanding   Last urine culture negative.  No smoking history Cystoscopy: Patient underwent flexible cystoscopy.  There was a lot of white flecks in the urine.  Urine was aspirated for culture.  Along the right lateral wall there was 1 small erythematous area approximately 1.5 cm in length.  It almost looks like she had some petechiae or glomerulations in that area.  She was not experiencing pain.  When I asked the patient the feeling that she needs to go all the time associated with some flow symptoms was her main complaint.    The urine certainly look cloudy today with white flecks.  I sent the aspirate urine for culture.  I would like to perform cystoscopy in approximately 10 weeks.  I do not think she has carcinoma.  She could have interstitial cystitis with the petechiae noted although her bladder had not been over distended.  If the findings continue I discussed with her the role of a hydrodistention in detail today with usual template.   I want to see if her urgency and flow symptoms down regulate or change in rescoped in 10 weeks.  She understands if if there is still redness we would do the hydrodistention as well  as a bladder biopsy.  I did not send a urine cytology today.  I put her on Macrodantin 100 mg 30x11  Today Frequency stable.  Last culture was negative Will taken once a day antibiotic.  Over the weekend she has some intermittent discomfort in the suprapubic area that would come and go and almost be stabbing.  She did not have it in the many weeks up to this.  Clinically not infected.  Patient underwent repeat cystoscopy utilizing sterile technique.  Bladder mucosa and trigone were normal.  She still had the white flecks in her urine.  Urine was sent for culture.   No blood in urine on microscopic examination.  I spent a lot of time cystoscope in her and she had no erythema or petechiae throughout the bladder or on the right side.  She did not have pain with bladder filling.  She had some prominent blood vessels but it high-volume the mucosa was normal  Once again she complains of hesitancy and feeling of incomplete bladder emptying.          PMH: Past Medical History:  Diagnosis Date   Asthma    Migraine    Shingles     Surgical History: Past Surgical History:  Procedure Laterality Date   ANKLE SURGERY Left    with screw   CHOLECYSTECTOMY     HEMORRHOID SURGERY     HEMORRHOID SURGERY N/A 05/30/2014   Procedure: HEMORRHOIDECTOMY;  Surgeon: Renda Rolls, MD;  Location: [facility] ORS;  Service: General;  Laterality: N/A;   LASIK     NASAL SINUS SURGERY     NOVASURE ABLATION      Home Medications:  Allergies as of 08/11/2020       Reactions   Imodium A-d [loperamide Hcl] Swelling   Facial and throat swelling        Medication List        Accurate as of August 11, 2020 10:40 AM. If you have any questions, ask your nurse or doctor.          nitrofurantoin 100 MG capsule Commonly known as: Macrodantin Take 1 capsule (100 mg total) by mouth daily.   predniSONE 10 MG (21) Tbpk tablet Commonly known as: STERAPRED UNI-PAK 21 TAB Take 10 mg by mouth as directed.   Robaxin-750 750 MG tablet Generic drug: methocarbamol Take 1 tablet by mouth every six to eight hours as needed FOR SPASMS   Symbicort 160-4.5 MCG/ACT inhaler Generic drug: budesonide-formoterol Inhale into the lungs.        Allergies:  Allergies  Allergen Reactions   Imodium A-D [Loperamide Hcl] Swelling    Facial and throat swelling    Family History: Family History  Problem Relation Age of Onset   Breast cancer Maternal Aunt 45   Breast cancer Maternal Aunt    Breast cancer Maternal Aunt     Social History:  reports that she has never smoked. She has  never used smokeless tobacco. She reports current alcohol use. She reports that she does not use drugs.  ROS:                                        Physical Exam: There were no vitals taken for this visit.  Constitutional:  Alert and oriented, No acute distress. HEENT: [place] AT, moist mucus membranes.  Trachea midline, no masses.   Laboratory Data: Lab Results  Component Value Date   WBC 7.4 04/05/2017   HGB 15.0 04/05/2017   HCT 43.8 04/05/2017   MCV 92.4 04/05/2017   PLT 360 04/05/2017    Lab Results  Component Value Date   CREATININE 0.56 04/05/2017    No results found for: PSA  No results found for: TESTOSTERONE  No results found for: HGBA1C  Urinalysis    Component Value Date/Time   COLORURINE YELLOW (A) 04/05/2017 2006   APPEARANCEUR Cloudy (A) 02/18/2020 0832   LABSPEC 1.010 04/05/2017 2006   PHURINE 6.0 04/05/2017 2006   GLUCOSEU Negative 02/18/2020 0832   HGBUR LARGE (A) 04/05/2017 2006   BILIRUBINUR Negative 02/18/2020 0832   KETONESUR 20 (A) 04/05/2017 2006   PROTEINUR Negative 02/18/2020 0832   PROTEINUR NEGATIVE 04/05/2017 2006   NITRITE Negative 02/18/2020 0832   NITRITE NEGATIVE 04/05/2017 2006   LEUKOCYTESUR Negative 02/18/2020 0832    Pertinent Imaging:   Assessment & Plan: I like to keep her on prophylaxis at least for now.  We talked about physical therapy and Flomax.  Insurance apparently does not cover physical therapy.  Reassess in 5 to 6 weeks on Flomax.  Proceed accordingly.  Stay on suppressive therapy at least for the short-term  1. Recurrent UTI  - Urinalysis, Complete   No follow-ups on file.  Martina Sinner, MD  [facility] [address], [address] [place], [place] [address] [phone]

## 2020-08-22 ENCOUNTER — Other Ambulatory Visit: Payer: Self-pay | Admitting: Internal Medicine

## 2020-08-22 DIAGNOSIS — Z1231 Encounter for screening mammogram for malignant neoplasm of breast: Secondary | ICD-10-CM

## 2020-10-06 ENCOUNTER — Encounter: Payer: Self-pay | Admitting: Urology

## 2020-10-06 ENCOUNTER — Ambulatory Visit (INDEPENDENT_AMBULATORY_CARE_PROVIDER_SITE_OTHER): Payer: 59 | Admitting: Urology

## 2020-10-06 ENCOUNTER — Other Ambulatory Visit: Payer: Self-pay

## 2020-10-06 VITALS — BP 109/73 | Ht 62.0 in | Wt 146.0 lb

## 2020-10-06 DIAGNOSIS — R339 Retention of urine, unspecified: Secondary | ICD-10-CM

## 2020-10-06 NOTE — Progress Notes (Signed)
10/06/2020 11:10 AM   Erie Noe 1970-12-22 [id number]  Referring provider: Enid Baas, MD [address] [place],  [place] [address]  Chief Complaint  Patient presents with   Recurrent UTI    HPI: I was consulted to assist the patient's two recent bladder infections and vague suprapubic discomfort.  She also has significant flow symptoms.  She can has some lower abdominal discomfort like a band across the lower abdomen.  Is not relieved by voiding.  Is not daily.  She had a CT scan a week ago with contrast that was normal   She has a lot of stopping and starting the stream with positional changes.  It takes more time to void.  She finally does feel empty.  This may have started since a few recent bladder infections   In July and November she had foul-smelling urine.  She had little bit of discomfort.  I think she had increased nocturia.  The foul-smelling improved with treatment of the infections.   Currently she voids every 60 to 90 minutes.  She gets up once a night.  No smoking history.  She had a bladder suspension years ago.   Patient has had 2 bladder infections possibly.  She has flow symptoms noted.  I will not start prophylaxis yet unless there is more culture prove.  I will offer Flomax and/or physical therapy or both.  I do not think she has interstitial cystitis at this stage.   On pelvic examination the patient has significant uterine descensus or cervical descensus.  She likely has a long cervix and a short anterior vaginal wall.  She had a mild cystocele with very little central defect.  Her tissues were quite elastic and hypermobile.  No stress incontinence.  No rectocele   A picture was drawn.  Conceptually her cervix could be obstructing her urine stream or altering the anatomy causing the symptoms.  She is asymptomatic and I think would be difficult to justify recommending a hysterectomy and/or vault suspension and/or cystocele repair.  I would  not be surprised that she just would need a hysterectomy and not the prolapse repair.  She would need urodynamics prior.  Unfortunately if unrelated and she had extensive surgery would not help her symptoms.  Patient has good understanding   Cystoscopy: Patient underwent flexible cystoscopy.  There was a lot of white flecks in the urine.  Urine was aspirated for culture.  Along the right lateral wall there was 1 small erythematous area approximately 1.5 cm in length.  It almost looks like she had some petechiae or glomerulations in that area.  She was not experiencing pain.   When I asked the patient the feeling that she needs to go all the time associated with some flow symptoms was her main complaint.    The urine certainly look cloudy today with white flecks.  I sent the aspirate urine for culture.  I would like to perform cystoscopy in approximately 10 weeks.  I do not think she has carcinoma.  She could have interstitial cystitis with the petechiae noted although her bladder had not been over distended.  If the findings continue I discussed with her the role of a hydrodistention in detail today with usual template.   I want to see if her urgency and flow symptoms down regulate or change in rescoped in 10 weeks.  She understands if if there is still redness we would do the hydrodistention as well as a bladder biopsy.  I  did not send a urine cytology today.  I put her on Macrodantin 100 mg 30x11   Last culture was negative Will taken once a day antibiotic.  Over the weekend she has some intermittent discomfort in the suprapubic area that would come and go and almost be stabbing.  She did not have it in the many weeks up to this.    Patient underwent repeat cystoscopy utilizing sterile technique.  It was normal and document   Once again she complains of hesitancy and feeling of incomplete bladder emptying.    I like to keep her on prophylaxis at least for now.  We talked about physical therapy and  Flomax.  Insurance apparently does not cover physical therapy.  Reassess in 5 to 6 weeks on Flomax.  Proceed accordingly.  Stay on suppressive therapy at least for the short-term  Today Flow did not respond to Flomax.  She had stopped the trimethoprim and clinically no infections.  Infection free today.  Reassurance given.  Questions answered    PMH: Past Medical History:  Diagnosis Date   Asthma    Migraine    Shingles     Surgical History: Past Surgical History:  Procedure Laterality Date   ANKLE SURGERY Left    with screw   CHOLECYSTECTOMY     HEMORRHOID SURGERY     HEMORRHOID SURGERY N/A 05/30/2014   Procedure: HEMORRHOIDECTOMY;  Surgeon: Renda Rolls, MD;  Location: [facility] ORS;  Service: General;  Laterality: N/A;   LASIK     NASAL SINUS SURGERY     NOVASURE ABLATION      Home Medications:  Allergies as of 10/06/2020       Reactions   Imodium A-d [loperamide Hcl] Swelling   Facial and throat swelling        Medication List        Accurate as of October 06, 2020 11:10 AM. If you have any questions, ask your nurse or doctor.          STOP taking these medications    gabapentin 300 MG capsule Commonly known as: NEURONTIN Stopped by: Martina Sinner, MD   meloxicam 15 MG tablet Commonly known as: MOBIC Stopped by: Martina Sinner, MD   nitrofurantoin 100 MG capsule Commonly known as: Macrodantin Stopped by: Martina Sinner, MD   Symbicort 160-4.5 MCG/ACT inhaler Generic drug: budesonide-formoterol Stopped by: Martina Sinner, MD   tamsulosin 0.4 MG Caps capsule Commonly known as: FLOMAX Stopped by: Martina Sinner, MD        Allergies:  Allergies  Allergen Reactions   Imodium A-D [Loperamide Hcl] Swelling    Facial and throat swelling    Family History: Family History  Problem Relation Age of Onset   Breast cancer Maternal Aunt 8   Breast cancer Maternal Aunt    Breast cancer Maternal Aunt     Social History:   reports that she has never smoked. She has never used smokeless tobacco. She reports current alcohol use. She reports that she does not use drugs.  ROS:                                        Physical Exam: BP 109/73   Ht 5\' 2"  (1.575 m)   Wt 66.2 kg   BMI 26.70 kg/m   Constitutional:  Alert and oriented, No acute distress. HEENT:  AT, moist mucus  membranes.  Trachea midline, no masses.  Laboratory Data: Lab Results  Component Value Date   WBC 7.4 04/05/2017   HGB 15.0 04/05/2017   HCT 43.8 04/05/2017   MCV 92.4 04/05/2017   PLT 360 04/05/2017    Lab Results  Component Value Date   CREATININE 0.56 04/05/2017    No results found for: PSA  No results found for: TESTOSTERONE  No results found for: HGBA1C  Urinalysis    Component Value Date/Time   COLORURINE YELLOW (A) 04/05/2017 2006   APPEARANCEUR Hazy (A) 08/11/2020 1044   LABSPEC 1.010 04/05/2017 2006   PHURINE 6.0 04/05/2017 2006   GLUCOSEU Negative 08/11/2020 1044   HGBUR LARGE (A) 04/05/2017 2006   BILIRUBINUR Negative 08/11/2020 1044   KETONESUR 20 (A) 04/05/2017 2006   PROTEINUR Negative 08/11/2020 1044   PROTEINUR NEGATIVE 04/05/2017 2006   NITRITE Negative 08/11/2020 1044   NITRITE NEGATIVE 04/05/2017 2006   LEUKOCYTESUR Negative 08/11/2020 1044    Pertinent Imaging:   Assessment & Plan: Flow symptoms bother some but cannot have physical therapy due to insurance.  Reassurance given.  I will see in 1 year.  If she starts getting recurrent bladder infections we can put her back on prophylaxis.  Otherwise we will treat uncommon infections as needed  There are no diagnoses linked to this encounter.  No follow-ups on file.  Martina Sinner, MD  [facility] [address], [address] [place], [place] [address] [phone]

## 2020-10-08 ENCOUNTER — Ambulatory Visit
Admission: RE | Admit: 2020-10-08 | Discharge: 2020-10-08 | Disposition: A | Discharge status: Home / Self Care | Payer: 59 | Source: Ambulatory Visit | Attending: Internal Medicine | Admitting: Internal Medicine

## 2020-10-08 ENCOUNTER — Other Ambulatory Visit: Payer: Self-pay

## 2020-10-08 DIAGNOSIS — Z1231 Encounter for screening mammogram for malignant neoplasm of breast: Secondary | ICD-10-CM | POA: Diagnosis not present

## 2020-10-15 ENCOUNTER — Other Ambulatory Visit: Payer: Self-pay | Admitting: Unknown Physician Specialty

## 2020-10-15 ENCOUNTER — Other Ambulatory Visit: Payer: Self-pay | Admitting: Internal Medicine

## 2020-10-15 DIAGNOSIS — N6310 Unspecified lump in the right breast, unspecified quadrant: Secondary | ICD-10-CM

## 2020-10-15 DIAGNOSIS — R921 Mammographic calcification found on diagnostic imaging of breast: Secondary | ICD-10-CM

## 2020-10-15 DIAGNOSIS — R221 Localized swelling, mass and lump, neck: Secondary | ICD-10-CM

## 2020-10-15 DIAGNOSIS — R222 Localized swelling, mass and lump, trunk: Secondary | ICD-10-CM

## 2020-10-15 DIAGNOSIS — R928 Other abnormal and inconclusive findings on diagnostic imaging of breast: Secondary | ICD-10-CM

## 2020-10-16 ENCOUNTER — Ambulatory Visit
Admission: RE | Admit: 2020-10-16 | Discharge: 2020-10-16 | Disposition: A | Discharge status: Home / Self Care | Payer: 59 | Source: Ambulatory Visit | Attending: Internal Medicine | Admitting: Internal Medicine

## 2020-10-16 ENCOUNTER — Other Ambulatory Visit: Payer: Self-pay

## 2020-10-16 DIAGNOSIS — R921 Mammographic calcification found on diagnostic imaging of breast: Secondary | ICD-10-CM | POA: Insufficient documentation

## 2020-10-16 DIAGNOSIS — R928 Other abnormal and inconclusive findings on diagnostic imaging of breast: Secondary | ICD-10-CM | POA: Diagnosis not present

## 2020-10-16 DIAGNOSIS — N6310 Unspecified lump in the right breast, unspecified quadrant: Secondary | ICD-10-CM | POA: Diagnosis present

## 2020-10-17 NOTE — OR Nursing (Signed)
Called pt at home and reviewed pre biopsy instructions including arrival time.

## 2020-10-22 ENCOUNTER — Ambulatory Visit
Admission: RE | Admit: 2020-10-22 | Discharge: 2020-10-22 | Disposition: A | Discharge status: Home / Self Care | Payer: 59 | Source: Ambulatory Visit | Attending: Unknown Physician Specialty | Admitting: Unknown Physician Specialty

## 2020-10-22 ENCOUNTER — Other Ambulatory Visit: Payer: Self-pay

## 2020-10-22 DIAGNOSIS — R221 Localized swelling, mass and lump, neck: Secondary | ICD-10-CM | POA: Diagnosis present

## 2020-10-22 DIAGNOSIS — R222 Localized swelling, mass and lump, trunk: Secondary | ICD-10-CM | POA: Diagnosis present

## 2020-11-26 ENCOUNTER — Ambulatory Visit: Payer: 59

## 2022-01-29 ENCOUNTER — Other Ambulatory Visit: Payer: Self-pay

## 2022-01-29 ENCOUNTER — Emergency Department
Admission: EM | Admit: 2022-01-29 | Discharge: 2022-01-30 | Disposition: A | Discharge status: Home / Self Care | Payer: Commercial Managed Care - PPO | Attending: Emergency Medicine | Admitting: Emergency Medicine

## 2022-01-29 DIAGNOSIS — J45909 Unspecified asthma, uncomplicated: Secondary | ICD-10-CM | POA: Diagnosis not present

## 2022-01-29 DIAGNOSIS — R110 Nausea: Secondary | ICD-10-CM | POA: Diagnosis not present

## 2022-01-29 DIAGNOSIS — R197 Diarrhea, unspecified: Secondary | ICD-10-CM | POA: Diagnosis not present

## 2022-01-29 DIAGNOSIS — M79602 Pain in left arm: Secondary | ICD-10-CM | POA: Diagnosis present

## 2022-01-29 DIAGNOSIS — R5383 Other fatigue: Secondary | ICD-10-CM | POA: Insufficient documentation

## 2022-01-29 NOTE — ED Notes (Signed)
Spoke with Dr. Starleen Blue and gave her the EKG, she stated labs do not seems necessary at this time.

## 2022-01-29 NOTE — ED Triage Notes (Addendum)
Pt c/o L arm pain that started today. Pt denies any injury. Sts she has been feeling tired and fatigued as well today. Pt denies chest pain.

## 2022-01-29 NOTE — ED Provider Notes (Signed)
[facility] Provider Note    Event Date/Time   First MD Initiated Contact with Patient 01/29/22 2334     (approximate)   History   Arm Pain and Fatigue   HPI  Diane Smith is a 52 y.o. female with history of asthma, migraines who presents emergency department with complaints of throbbing left arm pain worse with walking around that started today.  No chest pain, shortness of breath.  Has had some nausea and diarrhea.  No abdominal pain.  States she feels very tired.  No fever or cough.   No risk factors for CAD other than family history of father who had an MI in his 106s.   History provided by patient and husband.    Past Medical History:  Diagnosis Date   Asthma    Migraine    Shingles     Past Surgical History:  Procedure Laterality Date   ANKLE SURGERY Left    with screw   CHOLECYSTECTOMY     HEMORRHOID SURGERY     HEMORRHOID SURGERY N/A 05/30/2014   Procedure: HEMORRHOIDECTOMY;  Surgeon: Rochel Brome, MD;  Location: [facility] ORS;  Service: General;  Laterality: N/A;   LASIK     NASAL SINUS SURGERY     NOVASURE ABLATION      MEDICATIONS:  Prior to Admission medications   Not on File    Physical Exam   Triage Vital Signs: ED Triage Vitals  Enc Vitals Group     BP 01/29/22 2230 133/89     Pulse Rate 01/29/22 2230 95     Resp 01/29/22 2230 18     Temp 01/29/22 2230 98.4 F (36.9 C)     Temp Source 01/29/22 2230 Oral     SpO2 01/29/22 2230 94 %     Weight --      Height --      Head Circumference --      Peak Flow --      Pain Score 01/29/22 2231 7     Pain Loc --      Pain Edu? --      Excl. in [place]? --     Most recent vital signs: Vitals:   01/30/22 0156 01/30/22 0253  BP: 127/80 134/84  Pulse: 90 77  Resp: 18 16  Temp: 98.4 F (36.9 C) 98.5 F (36.9 C)  SpO2: 95% 99%    CONSTITUTIONAL: Alert and oriented and responds appropriately to questions. Well-appearing; well-nourished HEAD: Normocephalic,  atraumatic EYES: Conjunctivae clear, pupils appear equal, sclera nonicteric ENT: normal nose; moist mucous membranes NECK: Supple, normal ROM CARD: RRR; S1 and S2 appreciated; no murmurs, no clicks, no rubs, no gallops RESP: Normal chest excursion without splinting or tachypnea; breath sounds clear and equal bilaterally; no wheezes, no rhonchi, no rales, no hypoxia or respiratory distress, speaking full sentences ABD/GI: Normal bowel sounds; non-distended; soft, non-tender, no rebound, no guarding, no peritoneal signs BACK: The back appears normal EXT: Normal ROM in all joints; no deformity noted, no edema; no cyanosis, full range of motion in the left shoulder without tenderness or deformity, no calf tenderness or calf swelling, 2+ radial pulses bilaterally SKIN: Normal color for age and race; warm; no rash on exposed skin NEURO: Moves all extremities equally, normal speech PSYCH: The patient's mood and manner are appropriate.   ED Results / Procedures / Treatments   LABS: (all labs ordered are listed, but only abnormal results are displayed) Labs Reviewed  COMPREHENSIVE METABOLIC PANEL -  Abnormal; Notable for the following components:      Result Value   Potassium 3.4 (*)    Glucose, Bld 107 (*)    Calcium 8.8 (*)    All other components within normal limits  URINALYSIS, ROUTINE W REFLEX MICROSCOPIC - Abnormal; Notable for the following components:   Color, Urine YELLOW (*)    APPearance HAZY (*)    Ketones, ur 5 (*)    Protein, ur 30 (*)    Bacteria, UA RARE (*)    All other components within normal limits  CBC WITH DIFFERENTIAL/PLATELET  TSH  LIPASE, BLOOD  TROPONIN I (HIGH SENSITIVITY)  TROPONIN I (HIGH SENSITIVITY)     EKG:  EKG Interpretation  Date/Time:  Friday January 29 2022 22:37:36 EST Ventricular Rate:  94 PR Interval:  160 QRS Duration: 70 QT Interval:  352 QTC Calculation: 440 R Axis:   3 Text Interpretation: Normal sinus rhythm Cannot rule out  Anterior infarct , age undetermined Abnormal ECG When compared with ECG of 16-Jun-2015 06:06, No significant change was found Confirmed by Rochele Raring [phone]) on 01/29/2022 11:44:39 PM         RADIOLOGY: My personal review and interpretation of imaging:    I have personally reviewed all radiology reports.   No results found.   PROCEDURES:  Critical Care performed: No     .1-3 Lead EKG Interpretation  Performed by: Ryenn Howeth, Layla Maw, DO Authorized by: Latiffany Harwick, Layla Maw, DO     Interpretation: normal     ECG rate:  95   ECG rate assessment: normal     Rhythm: sinus rhythm     Ectopy: none     Conduction: normal       IMPRESSION / MDM / ASSESSMENT AND PLAN / ED COURSE  I reviewed the triage vital signs and the nursing notes.    Patient here with left arm pain, nausea and diarrhea.  The patient is on the cardiac monitor to evaluate for evidence of arrhythmia and/or significant heart rate changes.   DIFFERENTIAL DIAGNOSIS (includes but not limited to):   ACS, doubt PE, dissection, CHF, pneumonia, pneumothorax.  Symptoms may also be due to viral illness.  Doubt fracture or dislocation or septic arthritis.  Also includes anemia, electrolyte derangement, dehydration, thyroid dysfunction, UTI.   Patient's presentation is most consistent with acute presentation with potential threat to life or bodily function.   PLAN: EKG reviewed and interpreted by myself and shows no acute abnormality.  Will obtain CBC, CMP, TSH, troponin x 2, urinalysis.  She declines any pain or nausea medicine at this time.  I do not feel she needs an x-ray of the left shoulder.   MEDICATIONS GIVEN IN ED: Medications - No data to display   ED COURSE: Patient's labs are unremarkable.  No leukocytosis.  Normal electrolytes, renal function, LFTs, lipase.  Troponin x 2 negative.  Normal TSH.  Urine shows no signs of significant dehydration or infection.  I feel patient is safe for discharge home with  close outpatient follow-up.  Recommended Tylenol, ibuprofen as needed for pain control.  Will discharge with prescription of Zofran to her pharmacy.   At this time, I do not feel there is any life-threatening condition present. I reviewed all nursing notes, vitals, pertinent previous records.  All lab and urine results, EKGs, imaging ordered have been independently reviewed and interpreted by myself.  I reviewed all available radiology reports from any imaging ordered this visit.  Based on my assessment, I  feel the patient is safe to be discharged home without further emergent workup and can continue workup as an outpatient as needed. Discussed all findings, treatment plan as well as usual and customary return precautions.  They verbalize understanding and are comfortable with this plan.  Outpatient follow-up has been provided as needed.  All questions have been answered.    CONSULTS:  none   OUTSIDE RECORDS REVIEWED: Reviewed last internal medicine note on 01/11/2022.       FINAL CLINICAL IMPRESSION(S) / ED DIAGNOSES   Final diagnoses:  Left arm pain  Fatigue, unspecified type  Diarrhea, unspecified type  Nausea     Rx / DC Orders   ED Discharge Orders          Ordered    ondansetron (ZOFRAN-ODT) 4 MG disintegrating tablet  Every 6 hours PRN        01/30/22 0256             Note:  This document was prepared using Dragon voice recognition software and may include unintentional dictation errors.   Eliasar Hlavaty, Delice Bison, DO 01/30/22 [id number]

## 2022-01-30 LAB — COMPREHENSIVE METABOLIC PANEL
ALT: 16 U/L (ref 0–44)
AST: 22 U/L (ref 15–41)
Albumin: 4 g/dL (ref 3.5–5.0)
Alkaline Phosphatase: 86 U/L (ref 38–126)
Anion gap: 10 (ref 5–15)
BUN: 15 mg/dL (ref 6–20)
CO2: 24 mmol/L (ref 22–32)
Calcium: 8.8 mg/dL — ABNORMAL LOW (ref 8.9–10.3)
Chloride: 102 mmol/L (ref 98–111)
Creatinine, Ser: 0.61 mg/dL (ref 0.44–1.00)
GFR, Estimated: >60 mL/min (ref >60)
Glucose, Bld: 107 mg/dL — ABNORMAL HIGH (ref 70–99)
Potassium: 3.4 mmol/L — ABNORMAL LOW (ref 3.5–5.1)
Sodium: 136 mmol/L (ref 135–145)
Total Bilirubin: 1.1 mg/dL (ref 0.3–1.2)
Total Protein: 7.5 g/dL (ref 6.5–8.1)

## 2022-01-30 LAB — URINALYSIS, ROUTINE W REFLEX MICROSCOPIC
Bilirubin Urine: NEGATIVE
Glucose, UA: NEGATIVE mg/dL
Hgb urine dipstick: NEGATIVE
Ketones, ur: 5 mg/dL — AB
Leukocytes,Ua: NEGATIVE
Nitrite: NEGATIVE
Protein, ur: 30 mg/dL — AB
Specific Gravity, Urine: 1.026 (ref 1.005–1.030)
pH: 5 (ref 5.0–8.0)

## 2022-01-30 LAB — CBC WITH DIFFERENTIAL/PLATELET
Abs Immature Granulocytes: 0.02 10*3/uL (ref 0.00–0.07)
Basophils Absolute: 0 10*3/uL (ref 0.0–0.1)
Basophils Relative: 1 %
Eosinophils Absolute: 0.3 10*3/uL (ref 0.0–0.5)
Eosinophils Relative: 3 %
HCT: 43.3 % (ref 36.0–46.0)
Hemoglobin: 14.2 g/dL (ref 12.0–15.0)
Immature Granulocytes: 0 %
Lymphocytes Relative: 21 %
Lymphs Abs: 1.7 10*3/uL (ref 0.7–4.0)
MCH: 30.7 pg (ref 26.0–34.0)
MCHC: 32.8 g/dL (ref 30.0–36.0)
MCV: 93.7 fL (ref 80.0–100.0)
Monocytes Absolute: 0.6 10*3/uL (ref 0.1–1.0)
Monocytes Relative: 7 %
Neutro Abs: 5.7 10*3/uL (ref 1.7–7.7)
Neutrophils Relative %: 68 %
Platelets: 370 10*3/uL (ref 150–400)
RBC: 4.62 MIL/uL (ref 3.87–5.11)
RDW: 12.5 % (ref 11.5–15.5)
WBC: 8.4 10*3/uL (ref 4.0–10.5)
nRBC: 0 % (ref 0.0–0.2)

## 2022-01-30 LAB — TROPONIN I (HIGH SENSITIVITY)
Troponin I (High Sensitivity): 3 ng/L (ref <18)
Troponin I (High Sensitivity): 2 ng/L (ref <18)

## 2022-01-30 LAB — LIPASE, BLOOD: Lipase: 43 U/L (ref 11–51)

## 2022-01-30 LAB — TSH: TSH: 2.964 u[IU]/mL (ref 0.350–4.500)

## 2022-01-30 MED ORDER — ONDANSETRON 4 MG PO TBDP: 4.0000 mg | ORAL_TABLET | Freq: Four times a day (QID) | ORAL | 0 refills | Status: DC | PRN

## 2022-01-30 NOTE — Discharge Instructions (Addendum)
Your labs today were normal including 2 sets of cardiac labs, blood counts, electrolytes, thyroid.  Urine showed no sign of infection or dehydration.  I recommend close follow-up with your primary care doctor if symptoms continue.  Please return to the emergency department if you begin having chest pain, shortness of breath, lightheadedness, passout, calf swelling or calf tenderness.   You may alternate Tylenol 1000 mg every 6 hours as needed for pain, fever and Ibuprofen 800 mg every 6-8 hours as needed for pain, fever.  Please take Ibuprofen with food.  Do not take more than 4000 mg of Tylenol (acetaminophen) in a 24 hour period.

## 2022-03-30 ENCOUNTER — Ambulatory Visit
Admission: EM | Admit: 2022-03-30 | Discharge: 2022-03-30 | Disposition: A | Discharge status: Home / Self Care | Payer: Commercial Managed Care - PPO

## 2022-03-30 DIAGNOSIS — J0140 Acute pansinusitis, unspecified: Secondary | ICD-10-CM | POA: Diagnosis not present

## 2022-03-30 MED ORDER — PREDNISONE 20 MG PO TABS: 40.0000 mg | ORAL_TABLET | Freq: Every day | ORAL | 0 refills | Status: AC

## 2022-03-30 MED ORDER — AMOXICILLIN-POT CLAVULANATE 875-125 MG PO TABS: 1.0000 | ORAL_TABLET | Freq: Two times a day (BID) | ORAL | 0 refills | Status: DC

## 2022-03-30 NOTE — Discharge Instructions (Signed)
Start Augmentin twice daily for 7 days.  Take prednisone 40 mg in the morning for 4 days.  Do not take NSAIDs with this medication including aspirin, ibuprofen/Advil, naproxen/Aleve.  Continue over-the-counter medications including Mucinex, Flonase, nasal saline/sinus rinses.  If your symptoms or not improving within a week return for reevaluation.  If anything worsens you should be seen immediately.

## 2022-03-30 NOTE — ED Provider Notes (Signed)
[facility]    CSN: [id number] Arrival date & time: 03/30/22  1016      History   Chief Complaint Chief Complaint  Patient presents with   Nasal Congestion    HPI VERTELL YOUNKIN is a 52 y.o. female.   Patient presents today with a week plus long history of URI symptoms including nasal congestion, sinus pressure, headache, cough.  Denies any chest pain, shortness of breath, fever, nausea, vomiting.  She has been using over-the-counter medication including Mucinex and Alka-Seltzer plus without improvement of symptoms.  Denies any known sick contacts.  She does have a history of asthma several years ago but no longer requires medication on a regular basis.  She denies any associated shortness of breath, chest tightness, coughing fits.  She denies any recent antibiotics or steroids.  She does report a history of recurrent sinus infections with similar presentation.  She has had 2 sinus surgeries in the past.  She denies any recent antibiotics or steroids.    Past Medical History:  Diagnosis Date   Asthma    Migraine    Shingles     There are no problems to display for this patient.   Past Surgical History:  Procedure Laterality Date   ANKLE SURGERY Left    with screw   CHOLECYSTECTOMY     HEMORRHOID SURGERY     HEMORRHOID SURGERY N/A 05/30/2014   Procedure: HEMORRHOIDECTOMY;  Surgeon: Rochel Brome, MD;  Location: [facility] ORS;  Service: General;  Laterality: N/A;   LASIK     NASAL SINUS SURGERY     NOVASURE ABLATION      OB History   No obstetric history on file.      Home Medications    Prior to Admission medications   Medication Sig Start Date End Date Taking? Authorizing Provider  amoxicillin-clavulanate (AUGMENTIN) 875-125 MG tablet Take 1 tablet by mouth every 12 (twelve) hours. 03/30/22  Yes Giavana Rooke K, PA-C  predniSONE (DELTASONE) 20 MG tablet Take 2 tablets (40 mg total) by mouth daily for 4 days. 03/30/22 04/03/22 Yes Jezebel Pollet K, PA-C   sertraline (ZOLOFT) 50 MG tablet Take by mouth. 03/11/22 03/11/23 Yes [provider]    Family History Family History  Problem Relation Age of Onset   Breast cancer Maternal Aunt 55   Breast cancer Maternal Aunt    Breast cancer Maternal Aunt     Social History Social History   Tobacco Use   Smoking status: Never   Smokeless tobacco: Never  Substance Use Topics   Alcohol use: Yes    Alcohol/week: 0.0 standard drinks of alcohol   Drug use: No     Allergies   Imodium a-d [loperamide hcl]   Review of Systems Review of Systems  Constitutional:  Positive for activity change. Negative for appetite change, fatigue and fever.  HENT:  Positive for congestion, postnasal drip and sinus pressure. Negative for sneezing and sore throat.   Respiratory:  Positive for cough. Negative for shortness of breath.   Cardiovascular:  Negative for chest pain.  Gastrointestinal:  Negative for abdominal pain, diarrhea, nausea and vomiting.  Neurological:  Positive for headaches. Negative for dizziness and light-headedness.     Physical Exam Triage Vital Signs ED Triage Vitals  Enc Vitals Group     BP 03/30/22 1023 124/85     Pulse Rate 03/30/22 1023 92     Resp 03/30/22 1023 16     Temp 03/30/22 1023 98.2 F (36.8 C)  Temp Source 03/30/22 1023 Oral     SpO2 03/30/22 1023 97 %     Weight 03/30/22 1022 150 lb (68 kg)     Height 03/30/22 1022 '5\' 2"'$  (1.575 m)     Head Circumference --      Peak Flow --      Pain Score 03/30/22 1022 0     Pain Loc --      Pain Edu? --      Excl. in [place]? --    No data found.  Updated Vital Signs BP 124/85 (BP Location: Left Arm)   Pulse 92   Temp 98.2 F (36.8 C) (Oral)   Resp 16   Ht '5\' 2"'$  (1.575 m)   Wt 150 lb (68 kg)   LMP 09/21/2020   SpO2 97%   BMI 27.44 kg/m   Visual Acuity Right Eye Distance:   Left Eye Distance:   Bilateral Distance:    Right Eye Near:   Left Eye Near:    Bilateral Near:     Physical Exam Vitals  reviewed.  Constitutional:      General: She is awake. She is not in acute distress.    Appearance: Normal appearance. She is well-developed. She is not ill-appearing.     Comments: Very pleasant female appears stated age in no acute distress sitting comfortably in exam room  HENT:     Head: Normocephalic and atraumatic.     Right Ear: Tympanic membrane, ear canal and external ear normal. Tympanic membrane is not erythematous or bulging.     Left Ear: Ear canal and external ear normal. A middle ear effusion is present. Tympanic membrane is not erythematous or bulging.     Nose:     Right Sinus: Maxillary sinus tenderness and frontal sinus tenderness present.     Left Sinus: Maxillary sinus tenderness and frontal sinus tenderness present.     Mouth/Throat:     Pharynx: Uvula midline. No oropharyngeal exudate or posterior oropharyngeal erythema.  Cardiovascular:     Rate and Rhythm: Normal rate and regular rhythm.     Heart sounds: Normal heart sounds, S1 normal and S2 normal. No murmur heard. Pulmonary:     Effort: Pulmonary effort is normal.     Breath sounds: Normal breath sounds. No wheezing, rhonchi or rales.     Comments: Clear to auscultation bilaterally Psychiatric:        Behavior: Behavior is cooperative.      UC Treatments / Results  Labs (all labs ordered are listed, but only abnormal results are displayed) Labs Reviewed - No data to display  EKG   Radiology No results found.  Procedures Procedures (including critical care time)  Medications Ordered in UC Medications - No data to display  Initial Impression / Assessment and Plan / UC Course  I have reviewed the triage vital signs and the nursing notes.  Pertinent labs & imaging results that were available during my care of the patient were reviewed by me and considered in my medical decision making (see chart for details).     Patient is well-appearing, afebrile, nontoxic, nontachycardic.  Viral testing  was deferred as patient has been symptomatic for over a week and this would not change management.  Given prolonged and worsening symptoms concern for secondary bacterial infection.  Will start Augmentin twice daily for 7 days.  She was also given a prednisone burst of 40 mg for 4 days to help with her symptoms with instruction not to  take NSAIDs with this medication and risk of GI bleeding.  Encouraged use over-the-counter medication including Mucinex, Flonase, nasal saline/sinus rinses.  Discussed that if she has any worsening or changing symptoms she should return for reevaluation.  Strict return precautions given.  Patient declined work excuse note.  Final Clinical Impressions(s) / UC Diagnoses   Final diagnoses:  Acute non-recurrent pansinusitis     Discharge Instructions      Start Augmentin twice daily for 7 days.  Take prednisone 40 mg in the morning for 4 days.  Do not take NSAIDs with this medication including aspirin, ibuprofen/Advil, naproxen/Aleve.  Continue over-the-counter medications including Mucinex, Flonase, nasal saline/sinus rinses.  If your symptoms or not improving within a week return for reevaluation.  If anything worsens you should be seen immediately.     ED Prescriptions     Medication Sig Dispense Auth. Provider   amoxicillin-clavulanate (AUGMENTIN) 875-125 MG tablet Take 1 tablet by mouth every 12 (twelve) hours. 14 tablet Tamer Baughman K, PA-C   predniSONE (DELTASONE) 20 MG tablet Take 2 tablets (40 mg total) by mouth daily for 4 days. 8 tablet Ambre Kobayashi, Derry Skill, PA-C      PDMP not reviewed this encounter.   Terrilee Croak, PA-C 03/30/22 1049

## 2022-03-30 NOTE — ED Triage Notes (Signed)
Pt c/o nasal congestion & cough x1 wk.

## 2022-08-12 ENCOUNTER — Other Ambulatory Visit: Payer: Self-pay | Admitting: Obstetrics and Gynecology

## 2022-08-12 DIAGNOSIS — Z1231 Encounter for screening mammogram for malignant neoplasm of breast: Secondary | ICD-10-CM

## 2022-08-20 ENCOUNTER — Ambulatory Visit
Admission: RE | Admit: 2022-08-20 | Discharge: 2022-08-20 | Disposition: A | Discharge status: Home / Self Care | Payer: PRIVATE HEALTH INSURANCE | Source: Ambulatory Visit | Attending: Obstetrics and Gynecology | Admitting: Obstetrics and Gynecology

## 2022-08-20 DIAGNOSIS — Z1231 Encounter for screening mammogram for malignant neoplasm of breast: Secondary | ICD-10-CM | POA: Insufficient documentation

## 2023-04-13 ENCOUNTER — Institutional Professional Consult (permissible substitution) (INDEPENDENT_AMBULATORY_CARE_PROVIDER_SITE_OTHER): Payer: No Typology Code available for payment source

## 2023-05-11 ENCOUNTER — Encounter (INDEPENDENT_AMBULATORY_CARE_PROVIDER_SITE_OTHER): Payer: Self-pay

## 2023-05-11 ENCOUNTER — Ambulatory Visit (INDEPENDENT_AMBULATORY_CARE_PROVIDER_SITE_OTHER): Payer: No Typology Code available for payment source | Admitting: Otolaryngology

## 2023-05-11 VITALS — BP 127/78 | HR 78 | Ht 62.0 in | Wt 160.0 lb

## 2023-05-11 DIAGNOSIS — R0981 Nasal congestion: Secondary | ICD-10-CM | POA: Diagnosis not present

## 2023-05-11 DIAGNOSIS — R0989 Other specified symptoms and signs involving the circulatory and respiratory systems: Secondary | ICD-10-CM

## 2023-05-11 DIAGNOSIS — R0982 Postnasal drip: Secondary | ICD-10-CM | POA: Diagnosis not present

## 2023-05-11 DIAGNOSIS — K219 Gastro-esophageal reflux disease without esophagitis: Secondary | ICD-10-CM

## 2023-05-11 MED ORDER — AZELASTINE HCL 0.1 % NA SOLN: 2.0000 | Freq: Two times a day (BID) | NASAL | 12 refills | Status: AC

## 2023-05-11 MED ORDER — TRIAMCINOLONE ACETONIDE 55 MCG/ACT NA AERO: 2.0000 | INHALATION_SPRAY | Freq: Two times a day (BID) | NASAL | 12 refills | Status: AC

## 2023-05-11 MED ORDER — FAMOTIDINE 20 MG PO TABS: 20.0000 mg | ORAL_TABLET | Freq: Every day | ORAL | 0 refills | Status: AC

## 2023-05-11 NOTE — Patient Instructions (Signed)
 Use nasacort two sprays each nostril twice daily; right after the nasacort, use astelin spray two sprays each nostril Take pepcid 20mg  nightly for 1 month; can also get reflux gourmet (buy from [employer]) and take after meals When you feel like you need to clear your throat, take a sip of water; drink 64 oz/day.

## 2023-05-11 NOTE — Progress Notes (Signed)
 Otolaryngology Clinic Note HPI:  Diane Smith is a 53 y.o. female kindly referred for evaluation of post-nasal drainage and throat clearing.  Initial visit (04/2023): Patient reports that she has had some post nasal drainage for several months (thanksgiving), denies antecedent event including any URI. This causes her to clear her throat, and she feels like she is gagging. Maybe some intermittent congestion in nose. No facial pain/pressure, no discolored drainage from nose, smell is without issue. Denies typical AR symptoms - itchy eyes/nose, anterior rhinorrhea. Feels different than issue with her sinuses. Never had antibiotics but did get prednisone, did not help  She has tried singulair, but no difference. She has used nasacort before, but not currently for this. No afrin/tessalon/mucinex. Nothing helps it, nothing makes it worse. Some reflux/GERD symptoms lately - epigastric burning. 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  PMHx: Anxiety, Depression Independent Review of Additional Tests or Records:  Dr. Primus Brookes referral notes (02/01/2023): Noted post nasal drainage and sinus surgery x2. Noting waking up with phlegm in throat; no hoarseness, no cough; Dx: PND; Rx: Allegra, Singulair; ref to ENT CBC and CMP 09/08/2022: WBC 5.6, Hgb 14.8, Plt 313, Eos 280; BUN/Cr 18/0.06 CT Neck 05/16/2019 independently interpreted with respect to her sinuses - noted bilateral maxillary antrostomy, small amount of residual opacification right, and septum relatively midline; query anterior ethmoidectomy b/l - mild mucosal thickening; no significant other paranasal  sinus mucosal thickening; given it's a neck CT, however, suboptimal study PMH/Meds/All/SocHx/FamHx/ROS:   Past Medical History:  Diagnosis Date   Asthma    Migraine    Shingles      Past Surgical History:  Procedure Laterality Date   ANKLE SURGERY Left    with screw   CHOLECYSTECTOMY     HEMORRHOID SURGERY     HEMORRHOID SURGERY N/A 05/30/2014   Procedure: HEMORRHOIDECTOMY;  Surgeon: Hortensia Ma, MD;  Location: [facility] ORS;  Service: General;  Laterality: N/A;   LASIK     NASAL SINUS SURGERY     NOVASURE ABLATION      Family History  Problem Relation Age of Onset   Breast cancer Maternal Aunt 55   Breast cancer Maternal Aunt    Breast cancer Maternal Aunt      Social Connections: Not on file      Current Outpatient Medications:    azelastine (ASTELIN) 0.1 % nasal spray, Place 2 sprays into both nostrils 2 (two) times daily. Use in each nostril as directed, Disp: 30 mL, Rfl: 12   cetirizine (ZYRTEC) 10 MG tablet, , Disp: , Rfl:    escitalopram (LEXAPRO) 5 MG tablet, Take 5 mg by mouth daily., Disp: , Rfl:    famotidine (PEPCID) 20 MG tablet, Take 1 tablet (20 mg total) by mouth at bedtime., Disp: 30 tablet, Rfl: 0   montelukast (SINGULAIR) 10 MG tablet, Take 10 mg by mouth at bedtime., Disp: , Rfl:    triamcinolone (NASACORT) 55 MCG/ACT AERO nasal inhaler, Place 2 sprays into the nose 2 (two) times daily., Disp: 6 mL, Rfl: 12   amoxicillin-clavulanate (AUGMENTIN) 875-125 MG tablet, Take 1 tablet by mouth every 12 (twelve) hours. (Patient not taking: Reported on 05/11/2023), Disp: 14  tablet, Rfl: 0   sertraline (ZOLOFT) 50 MG tablet, Take by mouth., Disp: , Rfl:    Physical Exam:   BP 127/78 (BP Location: Left Wrist, Cuff Size: Normal)   Pulse 78   Ht 5\' 2"  (1.575 m)   Wt 160 lb (72.6 kg)   LMP 09/21/2020   SpO2 96%   BMI 29.26 kg/m   Salient findings:  CN II-XII intact  Bilateral EAC clear and TM intact with well pneumatized middle ear spaces Anterior rhinoscopy:  Septum relatively midline; bilateral inferior turbinates reduced (right more so than left). Nasal endoscopy was indicated to better evaluate the nose and paranasal sinuses, given the patient's history and exam findings, and is detailed below. No lesions of oral cavity/oropharynx; dentition fair No obviously palpable neck masses/lymphadenopathy/thyromegaly No respiratory distress or stridor; voice quality class 1.5; fair amount of throat clearing during visit  Seprately Identifiable Procedures:  Prior to initiating any procedures, risks/benefits/alternatives were explained to the patient and verbal consent obtained.  PROCEDURE: Bilateral Diagnostic Rigid Nasal Endoscopy Pre-procedure diagnosis: nasal congestion, PND, history of fess, rule out sinusitis Post-procedure diagnosis: same Indication: See pre-procedure diagnosis and physical exam above Complications: None apparent EBL: 0 mL Anesthesia: Lidocaine 4% and topical decongestant was topically sprayed in each nasal cavity  Description of Procedure:  Patient was identified. A rigid 30 degree endoscope was utilized to evaluate the sinonasal cavities, mucosa, sinus ostia and turbinates and septum.  Overall, signs of mucosal inflammation are not noted.  Also noted are b/l IT reduced; right uncinate intact? Left MT lateralized some; on right, can't see true max ostia but max is patent and anterior ethmoid as well, minimal polypoid edema.  No mucopurulence, polyps, or masses noted.   Right Middle meatus: clear Right SE Recess: clear Left MM: clear Left SE Recess: clear Photodocumentation was obtained.  CPT CODE -- 54098 - Mod 25  Procedure Note Pre-procedure diagnosis:  Chronic throat clearing Post-procedure diagnosis: Same Procedure: Transnasal Fiberoptic Laryngoscopy, CPT 11914 - Mod 25 Indication: see abov Complications: None apparent EBL: 0 mL  The procedure was undertaken to further evaluate the patient's complaint of chronic throat  clearing, with mirror exam inadequate for appropriate examination due to gag reflex and poor patient tolerance  Procedure:  Patient was identified as correct patient. Verbal consent was obtained. The nose was sprayed with oxymetazoline and 4% lidocaine. The The flexible laryngoscope was passed through the nose to view the nasal cavity, pharynx (oropharynx, hypopharynx) and larynx.  The larynx was examined at rest and during multiple phonatory tasks. Documentation was obtained and reviewed with patient. The scope was removed. The patient tolerated the procedure well.  Findings: The nasal cavity and nasopharynx did not reveal any masses or lesions, mucosa appeared to be without obvious lesions. The tongue base, pharyngeal walls, piriform sinuses, vallecula, epiglottis and postcricoid region are normal in appearance - mild post-cricoid and hypopharyngeal secretions. The visualized portion of the subglottis and proximal trachea is widely patent. The vocal folds are mobile bilaterally. There are no lesions on the free edge of the vocal folds nor elsewhere in the larynx worrisome for malignancy.    Electronically signed by: Evelina Hippo, MD 05/11/2023 9:14 AM   Impression & Plans:  Diane Smith is a 53 y.o. female with h/o FESS with:  1. Nasal congestion   2. Post-nasal drip   3. Chronic throat clearing    Multiple issues but main is post nasal drip; we discussed multifactorial etiology of these most commonly; is having  some GERD sx Can have allergy/nasal contribution as well as LPR, will treat for those - Start pepcid at bedtime (sx worst at night) - Start nasacort BID and astelin BID - Throat clearing strategies including water sipping discussed - d/w pt re: ref to voice for throat clearing but declined - f/u fall 2025   See below regarding exact medications prescribed this encounter including dosages and route: Meds ordered this encounter  Medications   famotidine (PEPCID) 20 MG  tablet    Sig: Take 1 tablet (20 mg total) by mouth at bedtime.    Dispense:  30 tablet    Refill:  0   triamcinolone (NASACORT) 55 MCG/ACT AERO nasal inhaler    Sig: Place 2 sprays into the nose 2 (two) times daily.    Dispense:  6 mL    Refill:  12   azelastine (ASTELIN) 0.1 % nasal spray    Sig: Place 2 sprays into both nostrils 2 (two) times daily. Use in each nostril as directed    Dispense:  30 mL    Refill:  12      Thank you for allowing me the opportunity to care for your patient. Please do not hesitate to contact me should you have any other questions.  Sincerely, Milon Aloe, MD Otolaryngologist (ENT), [facility] Phone: [phone] Fax: [phone]  05/11/2023, 9:14 AM   MDM:  Level 4 - [phone] Complexity/Problems addressed: mod Data complexity: mod - independent review of notes, labs; independent CT interpretation - Morbidity: mod   - Prescription Drug prescribed or managed: yes

## 2023-07-03 ENCOUNTER — Encounter: Payer: Self-pay | Admitting: Emergency Medicine

## 2023-07-03 ENCOUNTER — Ambulatory Visit
Admission: EM | Admit: 2023-07-03 | Discharge: 2023-07-03 | Disposition: A | Discharge status: Home / Self Care | Attending: Physician Assistant | Admitting: Physician Assistant

## 2023-07-03 DIAGNOSIS — J020 Streptococcal pharyngitis: Secondary | ICD-10-CM

## 2023-07-03 LAB — GROUP A STREP BY PCR: Group A Strep by PCR: DETECTED — AB

## 2023-07-03 MED ORDER — AMOXICILLIN 500 MG PO CAPS: 500.0000 mg | ORAL_CAPSULE | Freq: Two times a day (BID) | ORAL | 0 refills | Status: AC

## 2023-07-03 NOTE — ED Triage Notes (Signed)
 Patient states that she has the worst sore throat that she has ever had.  Patient states that her sore throat started yesterday. Patient denies any other cold symptoms.  Patient reports fever yesterday.

## 2023-07-03 NOTE — ED Provider Notes (Signed)
 [facility]    CSN: [id number] Arrival date & time: 07/03/23  0840      History   Chief Complaint Chief Complaint  Patient presents with   Sore Throat    HPI Diane Smith is a 53 y.o. female presenting for severe sore throat, fever up to 101 degrees, fatigue since yesterday.  Patient denies cough, congestion, body aches, chest pain or shortness of breath, vomiting or diarrhea.  Denies any sick contacts.  Has been taking Tylenol .  Patient says this is "the worst sore throat I have ever had."  States "I feel like I am going to pass out."  HPI  Past Medical History:  Diagnosis Date   Asthma    Migraine    Shingles     There are no active problems to display for this patient.   Past Surgical History:  Procedure Laterality Date   ANKLE SURGERY Left    with screw   CHOLECYSTECTOMY     HEMORRHOID SURGERY     HEMORRHOID SURGERY N/A 05/30/2014   Procedure: HEMORRHOIDECTOMY;  Surgeon: Hortensia Ma, MD;  Location: [facility] ORS;  Service: General;  Laterality: N/A;   LASIK     NASAL SINUS SURGERY     NOVASURE ABLATION      OB History   No obstetric history on file.      Home Medications    Prior to Admission medications   Medication Sig Start Date End Date Taking? Authorizing Provider  amoxicillin  (AMOXIL ) 500 MG capsule Take 1 capsule (500 mg total) by mouth 2 (two) times daily for 10 days. 07/03/23 07/13/23 Yes Nancy Axon B, PA-C  escitalopram (LEXAPRO) 5 MG tablet Take 5 mg by mouth daily.   Yes [provider]  azelastine  (ASTELIN ) 0.1 % nasal spray Place 2 sprays into both nostrils 2 (two) times daily. Use in each nostril as directed 05/11/23 06/10/23  Evelina Hippo, MD  cetirizine (ZYRTEC) 10 MG tablet     [provider]  famotidine  (PEPCID ) 20 MG tablet Take 1 tablet (20 mg total) by mouth at bedtime. 05/11/23 06/10/23  Evelina Hippo, MD  montelukast (SINGULAIR) 10 MG tablet Take 10 mg by mouth at bedtime.    [provider]  sertraline (ZOLOFT) 50 MG tablet Take by mouth. 03/11/22 03/11/23  [provider]  triamcinolone  (NASACORT ) 55 MCG/ACT AERO nasal inhaler Place 2 sprays into the nose 2 (two) times daily. 05/11/23   Evelina Hippo, MD    Family History Family History  Problem Relation Age of Onset   Breast cancer Maternal Aunt 55   Breast cancer Maternal Aunt    Breast cancer Maternal Aunt     Social History Social History   Tobacco Use   Smoking status: Never   Smokeless tobacco: Never  Vaping Use   Vaping status: Never Used  Substance Use Topics   Alcohol use: Yes    Alcohol/week: 0.0 standard drinks of alcohol   Drug use: No     Allergies   Imodium a-d [loperamide hcl]   Review of Systems Review of Systems  Constitutional:  Positive for fatigue and fever. Negative for chills and diaphoresis.  HENT:  Positive for sore throat. Negative for congestion, ear pain, rhinorrhea, sinus pressure and sinus pain.   Respiratory:  Negative for cough and shortness of breath.   Gastrointestinal:  Negative for abdominal pain, nausea and vomiting.  Musculoskeletal:  Negative for arthralgias and myalgias.  Skin:  Negative for rash.  Neurological:  Negative for weakness and headaches.  Hematological:  Negative for adenopathy.     Physical Exam Triage Vital Signs ED Triage Vitals  Encounter Vitals Group     BP 07/03/23 0858 114/74     Systolic BP Percentile --      Diastolic BP Percentile --      Pulse Rate 07/03/23 0858 (!) 102     Resp 07/03/23 0858 14     Temp 07/03/23 0858 98.6 F (37 C)     Temp Source 07/03/23 0858 Oral     SpO2 07/03/23 0858 97 %     Weight 07/03/23 0856 160 lb 0.9 oz (72.6 kg)     Height 07/03/23 0856 5\' 2"  (1.575 m)     Head Circumference --      Peak Flow --      Pain Score 07/03/23 0856 10     Pain Loc --      Pain Education --      Exclude from Growth Chart --    No data found.  Updated Vital Signs BP 114/74 (BP Location: Left Arm)   Pulse  (!) 102   Temp 98.6 F (37 C) (Oral)   Resp 14   Ht 5\' 2"  (1.575 m)   Wt 160 lb 0.9 oz (72.6 kg)   LMP 09/21/2020   SpO2 97%   BMI 29.27 kg/m      Physical Exam Vitals and nursing note reviewed.  Constitutional:      General: She is not in acute distress.    Appearance: Normal appearance. She is ill-appearing. She is not toxic-appearing.  HENT:     Head: Normocephalic and atraumatic.     Nose: Nose normal.     Mouth/Throat:     Mouth: Mucous membranes are moist.     Pharynx: Oropharynx is clear. Posterior oropharyngeal erythema present.  Eyes:     General: No scleral icterus.       Right eye: No discharge.        Left eye: No discharge.     Conjunctiva/sclera: Conjunctivae normal.  Cardiovascular:     Rate and Rhythm: Normal rate and regular rhythm.     Heart sounds: Normal heart sounds.  Pulmonary:     Effort: Pulmonary effort is normal. No respiratory distress.     Breath sounds: Normal breath sounds.  Musculoskeletal:     Cervical back: Neck supple.  Skin:    General: Skin is dry.  Neurological:     General: No focal deficit present.     Mental Status: She is alert. Mental status is at baseline.     Motor: No weakness.     Gait: Gait normal.  Psychiatric:        Mood and Affect: Mood normal.        Behavior: Behavior normal.      UC Treatments / Results  Labs (all labs ordered are listed, but only abnormal results are displayed) Labs Reviewed  GROUP A STREP BY PCR - Abnormal; Notable for the following components:      Result Value   Group A Strep by PCR DETECTED (*)    All other components within normal limits    EKG   Radiology No results found.  Procedures Procedures (including critical care time)  Medications Ordered in UC Medications - No data to display  Initial Impression / Assessment and Plan / UC Course  I have reviewed the triage vital signs and the nursing notes.  Pertinent labs & imaging results that were available during my  care of the patient were reviewed by me and considered in my medical decision making (see chart for details).   53 year old female presents for sore throat, fever and fatigue since yesterday.  Denies cough, congestion.  Taking Tylenol .  Patient is currently afebrile.  Ill-appearing but nontoxic.  On exam has erythema posterior pharynx.  Chest clear.  Heart regular rate and rhythm.  PCR strep test obtained. Positive. Treating with amoxicillin . Supportive care.    Final Clinical Impressions(s) / UC Diagnoses   Final diagnoses:  Strep throat   Discharge Instructions   None    ED Prescriptions     Medication Sig Dispense Auth. Provider   amoxicillin  (AMOXIL ) 500 MG capsule Take 1 capsule (500 mg total) by mouth 2 (two) times daily for 10 days. 20 capsule Floydene Hy, PA-C      PDMP not reviewed this encounter.   Floydene Hy, PA-C 07/03/23 [phone]

## 2023-10-11 ENCOUNTER — Ambulatory Visit (INDEPENDENT_AMBULATORY_CARE_PROVIDER_SITE_OTHER): Admitting: Otolaryngology

## 2023-11-14 ENCOUNTER — Other Ambulatory Visit: Payer: Self-pay | Admitting: Obstetrics and Gynecology

## 2023-11-14 DIAGNOSIS — Z1231 Encounter for screening mammogram for malignant neoplasm of breast: Secondary | ICD-10-CM
